# Patient Record
Sex: Female | Born: 1990 | Race: White | Hispanic: No | Marital: Single | Smoking: Never smoker
Health system: Southern US, Community
[De-identification: ages and names within clinical notes are randomized; demographics above are authoritative.]

---
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
## 2012-10-21 ENCOUNTER — Ambulatory Visit: Payer: Self-pay

## 2012-10-21 ENCOUNTER — Ambulatory Visit: Payer: Self-pay | Admitting: Emergency Medicine

## 2012-10-21 VITALS — BP 100/66 | HR 69 | Temp 97.9°F | Resp 18 | Ht 64.0 in | Wt 218.0 lb

## 2012-10-21 DIAGNOSIS — IMO0002 Reserved for concepts with insufficient information to code with codable children: Secondary | ICD-10-CM

## 2012-10-21 NOTE — Progress Notes (Signed)
  Subjective:    Patient ID: Jennifer Guzman, female    DOB: 1990-03-27, 22 y.o.   MRN: [id number]  HPI  22 YO female patient here today with an area on her thigh. She states "it is leaking water". It is not noticeable until it starts leaking. She first noticed the area last night. She denies pain unless pressure is applied.   She was 49-91 years old and presented with a bruise and unknown injury. Mom states this office did a biopsy. She was told it was just a birthmark. The bruise appears at random times. No pain is felt. The bruise goes away. The area never has leaked before.   Pt does not have insurance and cost is great concern at this time.  Review of Systems     Objective:   Physical Exam posterior right upper leg about 2 inches above the popliteal fossa is a small punctum when pressure is pushed around the punctum a clear urine color fluid is expressed.  [facility] reading (PRIMARY) by  Dr. Cleta Alberts normal x-rays of the femur .        Assessment & Plan:  Suspect Seroma We will culture serous fluid, Xray femur [facility] Dr Darrick Penna for Urgent consultation and [facility]. This could be simple lymphatic drainage. I am unsure why there is a punctum there where the fluid is draining. I just wanted to be sure there is not a fluid accumulation that would need to be addressed

## 2012-10-23 LAB — WOUND CULTURE
Gram Stain: NONE SEEN
Gram Stain: NONE SEEN
Gram Stain: NONE SEEN
Organism ID, Bacteria: NO GROWTH

## 2012-10-29 ENCOUNTER — Other Ambulatory Visit: Payer: Self-pay | Admitting: Sports Medicine

## 2016-01-04 ENCOUNTER — Emergency Department (HOSPITAL_COMMUNITY): Payer: BLUE CROSS/BLUE SHIELD

## 2016-01-04 ENCOUNTER — Encounter (HOSPITAL_COMMUNITY): Payer: Self-pay | Admitting: *Deleted

## 2016-01-04 ENCOUNTER — Emergency Department (HOSPITAL_COMMUNITY)
Admission: EM | Admit: 2016-01-04 | Discharge: 2016-01-05 | Disposition: A | Discharge status: Home / Self Care | Payer: BLUE CROSS/BLUE SHIELD | Attending: Emergency Medicine | Admitting: Emergency Medicine

## 2016-01-04 DIAGNOSIS — R079 Chest pain, unspecified: Secondary | ICD-10-CM

## 2016-01-04 DIAGNOSIS — R0789 Other chest pain: Secondary | ICD-10-CM | POA: Diagnosis not present

## 2016-01-04 DIAGNOSIS — R0602 Shortness of breath: Secondary | ICD-10-CM

## 2016-01-04 LAB — BASIC METABOLIC PANEL
Anion gap: 13 (ref 5–15)
BUN: 14 mg/dL (ref 6–20)
CO2: 21 mmol/L — ABNORMAL LOW (ref 22–32)
Calcium: 9.2 mg/dL (ref 8.9–10.3)
Chloride: 105 mmol/L (ref 101–111)
Creatinine, Ser: 0.84 mg/dL (ref 0.44–1.00)
GFR calc Af Amer: >60 mL/min (ref >60)
GFR calc non Af Amer: >60 mL/min (ref >60)
Glucose, Bld: 95 mg/dL (ref 65–99)
Potassium: 3 mmol/L — ABNORMAL LOW (ref 3.5–5.1)
Sodium: 139 mmol/L (ref 135–145)

## 2016-01-04 LAB — CBC
HCT: 39.3 % (ref 36.0–46.0)
Hemoglobin: 13.6 g/dL (ref 12.0–15.0)
MCH: 30.1 pg (ref 26.0–34.0)
MCHC: 34.6 g/dL (ref 30.0–36.0)
MCV: 86.9 fL (ref 78.0–100.0)
Platelets: 236 10*3/uL (ref 150–400)
RBC: 4.52 MIL/uL (ref 3.87–5.11)
RDW: 13.3 % (ref 11.5–15.5)
WBC: 13.7 10*3/uL — ABNORMAL HIGH (ref 4.0–10.5)

## 2016-01-04 LAB — D-DIMER, QUANTITATIVE: D-Dimer, Quant: 1.41 ug/mL-FEU — ABNORMAL HIGH (ref 0.00–0.50)

## 2016-01-04 LAB — I-STAT TROPONIN, ED: Troponin i, poc: 0.01 ng/mL (ref 0.00–0.08)

## 2016-01-04 MED ORDER — IOPAMIDOL (ISOVUE-370) INJECTION 76%
INTRAVENOUS | Status: AC
  Filled 2016-01-04: qty 100

## 2016-01-04 MED ORDER — IOPAMIDOL (ISOVUE-370) INJECTION 76%
INTRAVENOUS | Status: AC
  Administered 2016-01-04: 100 mL
  Filled 2016-01-04: qty 100

## 2016-01-04 NOTE — ED Provider Notes (Signed)
[facility]-EMERGENCY DEPT Provider Note   CSN: [id number] Arrival date & time: 01/04/16  1824     History   Chief Complaint Chief Complaint  Patient presents with  . Chest Pain  . Shortness of Breath  . Weakness    HPI Jennifer Guzman is a 25 y.o. female.  HPI   25 year old female who presents for evaluation of chest pain and shortness of breath. Patient report acute onset of sharp pain across the chest with associated shortness of breath, lightheadedness, feeling dizzy, pressure sensation in chest, and tingling sensation to her arms approximately 4 hours ago which lasted for about an hour and a half and has since resolved. Symptoms started when she was removing her bra. No specific treatment tried. Movement seems to make the pain worse. She has never had this symptom before. She denies any associated fever, chills, productive cough, hemoptysis, back pain, abdominal pain, nausea vomiting diarrhea, or rash. Denies any strenuous activity and denies any caffeine intake. No prior history of PE or DVT, no recent surgery, prolonged bed rest, unilateral leg swelling or calf pain, active cancer or taking oral hormone. No history of panic attack. No significant family history of premature cardiac death. Patient is a nonsmoker. She did report a month ago she was on a long trip traveling 1100 miles by car.   History reviewed. No pertinent past medical history.  There are no active problems to display for this patient.   History reviewed. No pertinent surgical history.  OB History    No data available       Home Medications    Prior to Admission medications   Medication Sig Start Date End Date Taking? Authorizing Provider  ibuprofen (ADVIL,MOTRIN) 200 MG tablet Take 400 mg by mouth every 6 (six) hours as needed (for pain).   Yes Historical Provider, MD    Family History History reviewed. No pertinent family history.  Social History Social History  Substance Use Topics  . Smoking  status: Never Smoker  . Smokeless tobacco: Not on file  . Alcohol use No     Allergies   Review of patient's allergies indicates no known allergies.   Review of Systems Review of Systems  All other systems reviewed and are negative.    Physical Exam Updated Vital Signs BP 119/67   Pulse 105   Temp 98.2 F (36.8 C) (Oral)   Resp 19   LMP 12/21/2015   SpO2 100%   Physical Exam  Constitutional: She appears well-developed and well-nourished. No distress.  Obese female resting comfortably appears to be in no acute distress.  HENT:  Head: Atraumatic.  Eyes: Conjunctivae are normal.  Neck: Normal range of motion. Neck supple. No JVD present.  Cardiovascular: Intact distal pulses.   Mild tachycardia without murmurs rubs or gallops  Pulmonary/Chest: Effort normal and breath sounds normal. No respiratory distress. She has no wheezes. She has no rales. She exhibits no tenderness.  Abdominal: Soft. She exhibits no distension. There is no tenderness.  Musculoskeletal:  Bilateral lower extremities without palpable cords, erythema, or edema.  Neurological: She is alert.  Skin: No rash noted.  Psychiatric: She has a normal mood and affect.  Nursing note and vitals reviewed.    ED Treatments / Results  Labs (all labs ordered are listed, but only abnormal results are displayed) Labs Reviewed  BASIC METABOLIC PANEL - Abnormal; Notable for the following:       Result Value   Potassium 3.0 (*)    CO2  21 (*)    All other components within normal limits  CBC - Abnormal; Notable for the following:    WBC 13.7 (*)    All other components within normal limits  D-DIMER, QUANTITATIVE (NOT AT [facility]) - Abnormal; Notable for the following:    D-Dimer, Quant 1.41 (*)    All other components within normal limits  I-STAT TROPOININ, ED    EKG  EKG Interpretation None      Date: 01/04/2016  Rate: 108  Rhythm: sinus tachycardia  QRS Axis: normal  Intervals: normal  ST/T Wave  abnormalities: normal  Conduction Disutrbances: none  Narrative Interpretation:   Old EKG Reviewed: No significant changes noted     Radiology Dg Chest 2 View  Result Date: 01/04/2016 CLINICAL DATA:  Chest pain EXAM: CHEST  2 VIEW COMPARISON:  None. FINDINGS: The heart size and mediastinal contours are within normal limits. Both lungs are clear. The visualized skeletal structures are unremarkable. IMPRESSION: No active cardiopulmonary disease. Electronically Signed   By: Marlan Palauharles  Clark M.D.   On: 01/04/2016 19:43   Ct Angio Chest Pe W Or Wo Contrast  Result Date: 01/05/2016 CLINICAL DATA:  Initial evaluation for acute chest pain, shortness of breath. 80 cc of Isovue EXAM: CT ANGIOGRAPHY CHEST WITH CONTRAST TECHNIQUE: Multidetector CT imaging of the chest was performed using the standard protocol during bolus administration of intravenous contrast. Multiplanar CT image reconstructions and MIPs were obtained to evaluate the vascular anatomy. CONTRAST:  Three hundred seventy. COMPARISON:  Prior radiograph from earlier the same day. FINDINGS: Cardiovascular: Intrathoracic aorta of normal caliber and appearance without acute abnormality. Great vessels within normal limits. Heart size normal.  No pericardial effusion. Pulmonary arteries adequately opacified for evaluation. Main pulmonary artery measures within normal limits for size at 2.6 cm. No filling defect to suggest acute pulmonary embolism identified. Re-formatted imaging confirms these findings. Mediastinum/Nodes: Visualized thyroid is normal. No mediastinal, hilar, or axillary adenopathy identified. Esophagus within normal limits. Lungs/Pleura: Mild hazy atelectatic he in the lower lobes bilaterally. Scattered air trapping present at the lung bases as well. No focal infiltrates. No pulmonary edema or pleural effusion. No pneumothorax. No worrisome pulmonary nodule or mass. Upper Abdomen: Visualized portions of the abdomen demonstrate no acute  abnormality. Layering stones present within the gallbladder lumen. No CT findings to suggest acute cholecystitis. No biliary dilatation. Musculoskeletal: No acute osseous abnormality. No worrisome lytic blastic osseous lesions. Review of the MIP images confirms the above findings. IMPRESSION: 1. No CT evidence for acute pulmonary embolism. 2. Mild dependent atelectasis within the lungs with mild scattered air trapping. No other acute cardiopulmonary abnormality identified. 3. Cholelithiasis. Electronically Signed   By: Rise MuBenjamin  McClintock M.D.   On: 01/05/2016 00:20    Procedures Procedures (including critical care time)  Medications Ordered in ED Medications  iopamidol (ISOVUE-370) 76 % injection (100 mLs  Contrast Given 01/04/16 2344)     Initial Impression / Assessment and Plan / ED Course  I have reviewed the triage vital signs and the nursing notes.  Pertinent labs & imaging results that were available during my care of the patient were reviewed by me and considered in my medical decision making (see chart for details).  Clinical Course    BP 114/79   Pulse 104   Temp 98.2 F (36.8 C) (Oral)   Resp 18   LMP 12/21/2015   SpO2 100%    Final Clinical Impressions(s) / ED Diagnoses   Final diagnoses:  Chest pain, unspecified type  Shortness of breath    New Prescriptions New Prescriptions   No medications on file   10:02 PM Patient presents with acute onset of chest pain or shortness of breath. The symptom has since resolved. She has a HEART score of 1, low risk for MACE.  She is mildly tachycardic, therefore unable to apply PERC criteria to r/o PE, therefore, d-dimer obtained.    10:46 PM Elevated d-dimer of 1.4.  Will obtain chest ct angio to r/o PE.    12:39 AM Chest CT angiogram shows no evidence of PE. Incidental finding of cholelithiasis. Patient at this time does not have any active pain. No tenderness to the right upper quadrant to suggest cholecystitis. At  this time patient is stable for discharge. Patient understands to return if her condition worsened. Encourage follow-up with primary care provider for further care.   Fayrene Helper, PA-C 01/05/16 0041    Raeford Razor, MD 01/09/16 1145

## 2016-01-04 NOTE — ED Triage Notes (Signed)
Pt states she took off her bra then had a sudden onset of chest pain, shortness of breath and weakness. Onset of pain about a hour ago. Pt denies cough, n/v/d, fevers.

## 2016-01-04 NOTE — ED Notes (Signed)
Patient back from CT.

## 2016-01-05 NOTE — Discharge Instructions (Signed)
You have been evaluated for your chest pain and shortness of breath.  No evidence of heart attack, blood clot in your lung, pneumonia or other concerning finding on today's exam.  You have evidence of gallstones in your gall bladder.  Please follow up with your doctor for further care.  Return if your condition worsen or if you have other concerns.

## 2016-01-05 NOTE — ED Notes (Signed)
Discharge instructions reviewed - voiced understanding 

## 2016-01-23 ENCOUNTER — Ambulatory Visit (INDEPENDENT_AMBULATORY_CARE_PROVIDER_SITE_OTHER): Payer: BLUE CROSS/BLUE SHIELD | Admitting: Physician Assistant

## 2016-01-23 VITALS — BP 118/84 | HR 81 | Temp 98.2°F | Resp 17 | Ht 64.0 in | Wt 280.0 lb

## 2016-01-23 DIAGNOSIS — R0789 Other chest pain: Secondary | ICD-10-CM | POA: Diagnosis not present

## 2016-01-23 DIAGNOSIS — K8021 Calculus of gallbladder without cholecystitis with obstruction: Secondary | ICD-10-CM

## 2016-01-23 DIAGNOSIS — R7989 Other specified abnormal findings of blood chemistry: Secondary | ICD-10-CM | POA: Diagnosis not present

## 2016-01-23 DIAGNOSIS — R945 Abnormal results of liver function studies: Secondary | ICD-10-CM

## 2016-01-23 LAB — POCT URINALYSIS DIP (MANUAL ENTRY)
BILIRUBIN UA: NEGATIVE
GLUCOSE UA: NEGATIVE
Ketones, POC UA: NEGATIVE
Leukocytes, UA: NEGATIVE
Nitrite, UA: NEGATIVE
Protein Ur, POC: NEGATIVE
RBC UA: NEGATIVE
SPEC GRAV UA: 1.02
Urobilinogen, UA: 1
pH, UA: 7

## 2016-01-23 LAB — POCT URINE PREGNANCY: Preg Test, Ur: NEGATIVE

## 2016-01-23 LAB — POCT CBC
Granulocyte percent: 85.4 %G — AB (ref 37–80)
HEMATOCRIT: 38.3 % (ref 37.7–47.9)
HEMOGLOBIN: 14 g/dL (ref 12.2–16.2)
LYMPH, POC: 1.1 (ref 0.6–3.4)
MCH: 31 pg (ref 27–31.2)
MCHC: 36.4 g/dL — AB (ref 31.8–35.4)
MCV: 85.2 fL (ref 80–97)
MID (cbc): 0.7 (ref 0–0.9)
MPV: 7.2 fL (ref 0–99.8)
POC GRANULOCYTE: 10.8 — AB (ref 2–6.9)
POC LYMPH PERCENT: 9 %L — AB (ref 10–50)
POC MID %: 5.6 % (ref 0–12)
Platelet Count, POC: 274 10*3/uL (ref 142–424)
RBC: 4.5 M/uL (ref 4.04–5.48)
RDW, POC: 12.8 %
WBC: 12.7 10*3/uL — AB (ref 4.6–10.2)

## 2016-01-23 LAB — POC MICROSCOPIC URINALYSIS (UMFC): MUCUS RE: ABSENT

## 2016-01-23 MED ORDER — CLONAZEPAM 0.5 MG PO TABS: 0.5000 mg | ORAL_TABLET | Freq: Two times a day (BID) | ORAL | 0 refills | Status: DC | PRN

## 2016-01-23 NOTE — Progress Notes (Signed)
01/25/2016 8:04 AM   DOB: 08/06/1990 / MRN: [id number]  SUBJECTIVE:  Jennifer Guzman is a 25 y.o. female presenting for cehst discomfort that started 3 weeks ago.  Describes this as a tightness and it will last anywhere from two to 5 hours.  Has been to the ED for evaluation which included negative trop, positive d-dimer with a negative CT angio.  She denies SOB, diaphoresis, radicular pattern or any exertional component.  Reports that when the pain leaves she is completely pain free.  She does not smoke or take birth control.  Pain is often brought about by eating.  Her mother and the whole family has a history of gall stones. She has not tried eating a fat free diet. She does have stress, and reports that she is in her junior year.   She has No Known Allergies.   She  has no past medical history on file.    She  reports that she has never smoked. She has never used smokeless tobacco. She reports that she does not drink alcohol or use drugs. She  has no sexual activity history on file. The patient  has no past surgical history on file.  Her family history includes Diabetes in her maternal grandmother; Heart disease in her mother.  Review of Systems  Constitutional: Negative for chills and fever.  Cardiovascular: Positive for chest pain. Negative for leg swelling and PND.  Gastrointestinal: Negative for nausea.  Musculoskeletal: Negative for myalgias.  Skin: Negative for itching and rash.  Neurological: Negative for dizziness and tingling.  Psychiatric/Behavioral: Negative for depression.    The problem list and medications were reviewed and updated by myself where necessary and exist elsewhere in the encounter.   OBJECTIVE:  BP 118/84 (BP Location: Right Arm, Patient Position: Sitting, Cuff Size: Large)   Pulse 81   Temp 98.2 F (36.8 C) (Oral)   Resp 17   Ht 5\' 4"  (1.626 m)   Wt 280 lb (127 kg)   LMP 01/14/2016   SpO2 99%   BMI 48.06 kg/m   Physical Exam  Constitutional: She  is oriented to person, place, and time.  Cardiovascular: Normal rate, regular rhythm and normal heart sounds.   Pulmonary/Chest: Effort normal and breath sounds normal.  Abdominal: Soft. Bowel sounds are normal. She exhibits no distension and no mass. There is no tenderness. There is no rebound and no guarding.  Musculoskeletal: Normal range of motion.  Neurological: She is alert and oriented to person, place, and time.  Skin: Skin is warm and dry.  Psychiatric: She has a normal mood and affect.    Pulse Readings from Last 3 Encounters:  01/23/16 81  01/05/16 87  10/21/12 69     Results for orders placed or performed in visit on 01/23/16 (from the past 72 hour(s))  Lipid panel     Status: Abnormal   Collection Time: 01/23/16  5:16 PM  Result Value Ref Range   Cholesterol 162 125 - 200 mg/dL   Triglycerides 63 <086<150 mg/dL   HDL 43 (L) >=57>=46 mg/dL   Total CHOL/HDL Ratio 3.8 <=5.0 Ratio   VLDL 13 <30 mg/dL   LDL Cholesterol 846106 <962<130 mg/dL    Comment:   Total Cholesterol/HDL Ratio:CHD Risk                        Coronary Heart Disease Risk Table  Men       Women          1/2 Average Risk              3.4        3.3              Average Risk              5.0        4.4           2X Average Risk              9.6        7.1           3X Average Risk             23.4       11.0 Use the calculated Patient Ratio above and the CHD Risk table  to determine the patient's CHD Risk.   TSH     Status: None   Collection Time: 01/23/16  5:16 PM  Result Value Ref Range   TSH 1.57 mIU/L    Comment:   Reference Range   > or = 20 Years  0.40-4.50   Pregnancy Range First trimester  0.26-2.66 Second trimester 0.55-2.73 Third trimester  0.43-2.91     COMPLETE METABOLIC PANEL WITH GFR     Status: Abnormal   Collection Time: 01/23/16  5:16 PM  Result Value Ref Range   Sodium 137 135 - 146 mmol/L   Potassium 3.9 3.5 - 5.3 mmol/L   Chloride 104 98 -  110 mmol/L   CO2 24 20 - 31 mmol/L   Glucose, Bld 103 (H) 65 - 99 mg/dL   BUN 13 7 - 25 mg/dL   Creat 1.61 0.96 - 0.45 mg/dL   Total Bilirubin 1.1 0.2 - 1.2 mg/dL   Alkaline Phosphatase 88 33 - 115 U/L   AST 168 (H) 10 - 30 U/L   ALT 124 (H) 6 - 29 U/L   Total Protein 7.1 6.1 - 8.1 g/dL   Albumin 4.0 3.6 - 5.1 g/dL   Calcium 9.4 8.6 - 40.9 mg/dL   GFR, Est African American >89 >=60 mL/min   GFR, Est Non African American >89 >=60 mL/min  Lipase     Status: None   Collection Time: 01/23/16  5:16 PM  Result Value Ref Range   Lipase 16 7 - 60 U/L  Hemoglobin A1c     Status: None   Collection Time: 01/23/16  5:16 PM  Result Value Ref Range   Hgb A1c MFr Bld 4.6 <5.7 %    Comment:   For the purpose of screening for the presence of diabetes:   <5.7%       Consistent with the absence of diabetes 5.7-6.4 %   Consistent with increased risk for diabetes (prediabetes) >=6.5 %     Consistent with diabetes   This assay result is consistent with a decreased risk of diabetes.   Currently, no consensus exists regarding use of hemoglobin A1c for diagnosis of diabetes in children.   According to American Diabetes Association (ADA) guidelines, hemoglobin A1c <7.0% represents optimal control in non-pregnant diabetic patients. Different metrics may apply to specific patient populations. Standards of Medical Care in Diabetes (ADA).      Mean Plasma Glucose 85 mg/dL  POCT CBC     Status: Abnormal   Collection Time: 01/23/16  5:28 PM  Result Value Ref Range  WBC 12.7 (A) 4.6 - 10.2 K/uL   Lymph, poc 1.1 0.6 - 3.4   POC LYMPH PERCENT 9.0 (A) 10 - 50 %L   MID (cbc) 0.7 0 - 0.9   POC MID % 5.6 0 - 12 %M   POC Granulocyte 10.8 (A) 2 - 6.9   Granulocyte percent 85.4 (A) 37 - 80 %G   RBC 4.50 4.04 - 5.48 M/uL   Hemoglobin 14.0 12.2 - 16.2 g/dL   HCT, POC 40.9 81.1 - 47.9 %   MCV 85.2 80 - 97 fL   MCH, POC 31.0 27 - 31.2 pg   MCHC 36.4 (A) 31.8 - 35.4 g/dL   RDW, POC 91.4 %   Platelet  Count, POC 274 142 - 424 K/uL   MPV 7.2 0 - 99.8 fL  POCT Microscopic Urinalysis ([facility])     Status: None   Collection Time: 01/23/16  5:55 PM  Result Value Ref Range   WBC,UR,HPF,POC None None WBC/hpf   RBC,UR,HPF,POC None None RBC/hpf   Bacteria None None, Too numerous to count   Mucus Absent Absent   Epithelial Cells, UR Per Microscopy None None, Too numerous to count cells/hpf  POCT urine pregnancy     Status: None   Collection Time: 01/23/16  5:55 PM  Result Value Ref Range   Preg Test, Ur Negative Negative  POCT urinalysis dipstick     Status: None   Collection Time: 01/23/16  5:55 PM  Result Value Ref Range   Color, UA yellow yellow   Clarity, UA clear clear   Glucose, UA negative negative   Bilirubin, UA negative negative   Ketones, POC UA negative negative   Spec Grav, UA 1.020    Blood, UA negative negative   pH, UA 7.0    Protein Ur, POC negative negative   Urobilinogen, UA 1.0    Nitrite, UA Negative Negative   Leukocytes, UA Negative Negative    No results found.  ASSESSMENT AND PLAN  Susane was seen today for chest pain.  Diagnoses and all orders for this visit:  Chest discomfort: EKG and previous labs reassuring.  This is likely anxiety or a gall bladder etiology.  I will screen for gall bladder disease in her labs, rule out pregnancy, and go ahead and try her on an anxiolytic.  If this fails and my labs are negative I will likely send her to GI given her most recent scan shows gall stones without evidence of end organ damage. She will try a fat free diet in the meantime.  -     POCT Microscopic Urinalysis ([facility]) -     POCT urine pregnancy -     POCT urinalysis dipstick -     POCT CBC -     Lipid panel -     TSH -     COMPLETE METABOLIC PANEL WITH GFR -     Lipase -     Hemoglobin A1c -     EKG 12-Lead    The patient is advised to call or return to clinic if she does not see an improvement in symptoms, or to seek the care of the closest emergency  department if she worsens with the above plan.   Deliah Boston, MHS, PA-C [facility] 01/25/2016 8:03 AM    Addendum 01/25/2016 8:05 AM: LFTs, recent scan, in conjunction with WBC pointing to a gall bladder etiology.  I spoke with patient last night who  has been avoiding fat in her diet and had no pain yesterday.  I am referring her to general surgery.  She will stay on low fat diet until that time.  Advised she avoid Klonopin. Deliah BostonMichael Norva Bowe, MS, PA-C 8:06 AM, 01/25/2016

## 2016-01-23 NOTE — Patient Instructions (Addendum)
Avoid any and all fats for now. Take Klonopin if this occurs again to see if it helps your symptoms.     IF you received an x-ray today, you will receive an invoice from [facility]. Please contact [facility] at [phone] with questions or concerns regarding your invoice.   IF you received labwork today, you will receive an invoice from [facility]. Please contact [facility] at [phone] with questions or concerns regarding your invoice.   Our billing staff will not be able to assist you with questions regarding bills from these companies.  You will be contacted with the lab results as soon as they are available. The fastest way to get your results is to activate your My Chart account. Instructions are located on the last page of this paperwork. If you have not heard from us regarding the results in 2 weeks, please contact this office.

## 2016-01-24 LAB — COMPLETE METABOLIC PANEL WITH GFR
ALBUMIN: 4 g/dL (ref 3.6–5.1)
ALK PHOS: 88 U/L (ref 33–115)
ALT: 124 U/L — AB (ref 6–29)
AST: 168 U/L — ABNORMAL HIGH (ref 10–30)
BILIRUBIN TOTAL: 1.1 mg/dL (ref 0.2–1.2)
BUN: 13 mg/dL (ref 7–25)
CALCIUM: 9.4 mg/dL (ref 8.6–10.2)
CHLORIDE: 104 mmol/L (ref 98–110)
CO2: 24 mmol/L (ref 20–31)
CREATININE: 0.78 mg/dL (ref 0.50–1.10)
GFR, Est Non African American: >89 mL/min (ref >=60)
Glucose, Bld: 103 mg/dL — ABNORMAL HIGH (ref 65–99)
Potassium: 3.9 mmol/L (ref 3.5–5.3)
Sodium: 137 mmol/L (ref 135–146)
Total Protein: 7.1 g/dL (ref 6.1–8.1)

## 2016-01-24 LAB — LIPID PANEL
Cholesterol: 162 mg/dL (ref 125–200)
HDL: 43 mg/dL — AB (ref >=46)
LDL CALC: 106 mg/dL (ref <130)
TRIGLYCERIDES: 63 mg/dL (ref <150)
Total CHOL/HDL Ratio: 3.8 Ratio (ref <=5.0)
VLDL: 13 mg/dL (ref <30)

## 2016-01-24 LAB — TSH: TSH: 1.57 mIU/L

## 2016-01-24 LAB — LIPASE: LIPASE: 16 U/L (ref 7–60)

## 2016-01-25 LAB — HEMOGLOBIN A1C
HEMOGLOBIN A1C: 4.6 % (ref <5.7)
Mean Plasma Glucose: 85 mg/dL

## 2016-01-25 NOTE — Progress Notes (Signed)
[facility] info to [facility] -- called to verify, states that the provider will review and schedule accordingly. Stressed urgency when speaking with their office.

## 2016-08-23 ENCOUNTER — Encounter (HOSPITAL_COMMUNITY): Payer: Self-pay

## 2016-08-23 ENCOUNTER — Emergency Department (HOSPITAL_COMMUNITY): Payer: Self-pay

## 2016-08-23 ENCOUNTER — Inpatient Hospital Stay (HOSPITAL_COMMUNITY)
Admission: EM | Admit: 2016-08-23 | Discharge: 2016-08-27 | DRG: 418 | Disposition: A | Discharge status: Home / Self Care | Payer: Self-pay | Attending: Family Medicine | Admitting: Family Medicine

## 2016-08-23 DIAGNOSIS — Z6841 Body Mass Index (BMI) 40.0 and over, adult: Secondary | ICD-10-CM

## 2016-08-23 DIAGNOSIS — E669 Obesity, unspecified: Secondary | ICD-10-CM | POA: Diagnosis present

## 2016-08-23 DIAGNOSIS — K8067 Calculus of gallbladder and bile duct with acute and chronic cholecystitis with obstruction: Principal | ICD-10-CM | POA: Diagnosis present

## 2016-08-23 DIAGNOSIS — E876 Hypokalemia: Secondary | ICD-10-CM | POA: Diagnosis present

## 2016-08-23 DIAGNOSIS — K8050 Calculus of bile duct without cholangitis or cholecystitis without obstruction: Secondary | ICD-10-CM

## 2016-08-23 DIAGNOSIS — K8001 Calculus of gallbladder with acute cholecystitis with obstruction: Secondary | ICD-10-CM

## 2016-08-23 LAB — URINALYSIS, ROUTINE W REFLEX MICROSCOPIC
GLUCOSE, UA: NEGATIVE mg/dL
Hgb urine dipstick: NEGATIVE
KETONES UR: 80 mg/dL — AB
Leukocytes, UA: NEGATIVE
NITRITE: NEGATIVE
PH: 5 (ref 5.0–8.0)
PROTEIN: 100 mg/dL — AB
Specific Gravity, Urine: 1.029 (ref 1.005–1.030)

## 2016-08-23 LAB — CBC
HCT: 40.3 % (ref 36.0–46.0)
HEMOGLOBIN: 13.9 g/dL (ref 12.0–15.0)
MCH: 29.5 pg (ref 26.0–34.0)
MCHC: 34.5 g/dL (ref 30.0–36.0)
MCV: 85.6 fL (ref 78.0–100.0)
PLATELETS: 270 10*3/uL (ref 150–400)
RBC: 4.71 MIL/uL (ref 3.87–5.11)
RDW: 12.9 % (ref 11.5–15.5)
WBC: 7.5 10*3/uL (ref 4.0–10.5)

## 2016-08-23 LAB — COMPREHENSIVE METABOLIC PANEL
ALK PHOS: 128 U/L — AB (ref 38–126)
ALT: 242 U/L — ABNORMAL HIGH (ref 14–54)
ANION GAP: 14 (ref 5–15)
AST: 86 U/L — ABNORMAL HIGH (ref 15–41)
Albumin: 3.9 g/dL (ref 3.5–5.0)
BUN: <5 mg/dL — ABNORMAL LOW (ref 6–20)
CALCIUM: 9.4 mg/dL (ref 8.9–10.3)
CO2: 23 mmol/L (ref 22–32)
Chloride: 99 mmol/L — ABNORMAL LOW (ref 101–111)
Creatinine, Ser: 0.8 mg/dL (ref 0.44–1.00)
Glucose, Bld: 80 mg/dL (ref 65–99)
Potassium: 3.5 mmol/L (ref 3.5–5.1)
SODIUM: 136 mmol/L (ref 135–145)
TOTAL PROTEIN: 8 g/dL (ref 6.5–8.1)
Total Bilirubin: 6.6 mg/dL — ABNORMAL HIGH (ref 0.3–1.2)

## 2016-08-23 LAB — I-STAT BETA HCG BLOOD, ED (MC, WL, AP ONLY): I-stat hCG, quantitative: <5 m[IU]/mL (ref <5)

## 2016-08-23 LAB — LIPASE, BLOOD: LIPASE: 27 U/L (ref 11–51)

## 2016-08-23 MED ORDER — ONDANSETRON HCL 4 MG/2ML IJ SOLN
4.0000 mg | Freq: Once | INTRAMUSCULAR | Status: AC
  Administered 2016-08-23: 4 mg via INTRAVENOUS
  Filled 2016-08-23: qty 2

## 2016-08-23 MED ORDER — MORPHINE SULFATE (PF) 4 MG/ML IV SOLN
4.0000 mg | Freq: Once | INTRAVENOUS | Status: AC
  Administered 2016-08-23: 4 mg via INTRAVENOUS
  Filled 2016-08-23: qty 1

## 2016-08-23 MED ORDER — SODIUM CHLORIDE 0.9 % IV BOLUS (SEPSIS)
1000.0000 mL | Freq: Once | INTRAVENOUS | Status: AC
  Administered 2016-08-23: 1000 mL via INTRAVENOUS

## 2016-08-23 NOTE — ED Provider Notes (Signed)
[facility]-EMERGENCY DEPT Provider Note   CSN: [id number] Arrival date & time: 08/23/16  1914     History   Chief Complaint Chief Complaint  Patient presents with  . Abdominal Pain    HPI Jennifer Guzman is a 26 y.o. female.  Patient presents to the ED with a chief complaint of RUQ pain.  She states that she has known gallstones.  She states that she has been having intermittent symptoms for several months, but her symptoms significantly worsened in the past week.  She reports that she has had persistent nausea and vomiting for the past week, and has been unable to keep anything down.  She denies any fevers, or chills.  Denies any other associated symptoms.  She has been trying to control her symptoms with diet with no relief.  Her boyfriend states that her eyes seem jaundice.   The history is provided by the patient. No language interpreter was used.    History reviewed. No pertinent past medical history.  There are no active problems to display for this patient.   History reviewed. No pertinent surgical history.  OB History    No data available       Home Medications    Prior to Admission medications   Medication Sig Start Date End Date Taking? Authorizing Provider  clonazePAM (KLONOPIN) 0.5 MG tablet Take 1 tablet (0.5 mg total) by mouth 2 (two) times daily as needed for anxiety. 01/23/16   Ofilia Neas, PA-C  ibuprofen (ADVIL,MOTRIN) 200 MG tablet Take 400 mg by mouth every 6 (six) hours as needed (for pain).    [provider]    Family History Family History  Problem Relation Age of Onset  . Heart disease Mother   . Diabetes Maternal Grandmother     Social History Social History  Substance Use Topics  . Smoking status: Never Smoker  . Smokeless tobacco: Never Used  . Alcohol use No     Allergies   Patient has no known allergies.   Review of Systems Review of Systems  All other systems reviewed and are negative.    Physical Exam Updated  Vital Signs BP 133/81   Pulse 86   Temp 98.3 F (36.8 C) (Oral)   Resp 18   LMP 08/10/2016   SpO2 100%   Physical Exam  Constitutional: She is oriented to person, place, and time. She appears well-developed and well-nourished.  HENT:  Head: Normocephalic and atraumatic.  Eyes: Conjunctivae and EOM are normal. Pupils are equal, round, and reactive to light. Scleral icterus (mild) is present.  Neck: Normal range of motion. Neck supple.  Cardiovascular: Normal rate and regular rhythm.  Exam reveals no gallop and no friction rub.   No murmur heard. Pulmonary/Chest: Effort normal and breath sounds normal. No respiratory distress. She has no wheezes. She has no rales. She exhibits no tenderness.  Abdominal: Soft. Bowel sounds are normal. She exhibits no distension and no mass. There is tenderness. There is no rebound and no guarding.  RUQ tender to palpation  Musculoskeletal: Normal range of motion. She exhibits no edema or tenderness.  Neurological: She is alert and oriented to person, place, and time.  Skin: Skin is warm and dry.  Psychiatric: She has a normal mood and affect. Her behavior is normal. Judgment and thought content normal.  Nursing note and vitals reviewed.    ED Treatments / Results  Labs (all labs ordered are listed, but only abnormal results are displayed) Labs Reviewed  COMPREHENSIVE METABOLIC PANEL - Abnormal; Notable for the following:       Result Value   Chloride 99 (*)    BUN <5 (*)    AST 86 (*)    ALT 242 (*)    Alkaline Phosphatase 128 (*)    Total Bilirubin 6.6 (*)    All other components within normal limits  URINALYSIS, ROUTINE W REFLEX MICROSCOPIC - Abnormal; Notable for the following:    Color, Urine AMBER (*)    APPearance HAZY (*)    Bilirubin Urine MODERATE (*)    Ketones, ur 80 (*)    Protein, ur 100 (*)    Bacteria, UA FEW (*)    Squamous Epithelial / LPF 6-30 (*)    All other components within normal limits  LIPASE, BLOOD  CBC    I-STAT BETA HCG BLOOD, ED ([facility], [facility], [facility] ONLY)    EKG  EKG Interpretation None       Radiology No results found.  Procedures Procedures (including critical care time)  Medications Ordered in ED Medications  morphine 4 MG/ML injection 4 mg (not administered)  ondansetron (ZOFRAN) injection 4 mg (not administered)  sodium chloride 0.9 % bolus 1,000 mL (not administered)     Initial Impression / Assessment and Plan / ED Course  I have reviewed the triage vital signs and the nursing notes.  Pertinent labs & imaging results that were available during my care of the patient were reviewed by me and considered in my medical decision making (see chart for details).     Patient with RUQ pain.  Worsened this week.  Persistent n/v x 1 week.  Appears dry.  Will give fluids, treat pain, and check US.  LFTs are up.   US shows CBD dilatation.  Concerning for choledocholithiasis.  Discussed the case with on-call GI from [facility], who recommends ERCP, but will likely happen Monday morning.  Appreciate Dr. Julian ReilGardner for admitting the patient. Final Clinical Impressions(s) / ED Diagnoses   Final diagnoses:  Choledocholithiasis    New Prescriptions New Prescriptions   No medications on file     Roxy HorsemanBrowning, Marae Cottrell, Cordelia Poche-C 08/24/16 [id number]    Derwood KaplanNanavati, Ankit, MD 08/24/16 1530

## 2016-08-23 NOTE — ED Triage Notes (Signed)
Pt dx with gallstones in 2017.  Pt has been monitoring food intake per surgeon.  Pain has been having pain that lasts 8-9 hours one a month since dx, then 1 week ago pain has been everyday.  Pt will vomit and pain will decrease,

## 2016-08-24 DIAGNOSIS — K8050 Calculus of bile duct without cholangitis or cholecystitis without obstruction: Secondary | ICD-10-CM

## 2016-08-24 DIAGNOSIS — R7989 Other specified abnormal findings of blood chemistry: Secondary | ICD-10-CM

## 2016-08-24 DIAGNOSIS — R1011 Right upper quadrant pain: Secondary | ICD-10-CM

## 2016-08-24 LAB — COMPREHENSIVE METABOLIC PANEL
ALT: 199 U/L — ABNORMAL HIGH (ref 14–54)
ANION GAP: 15 (ref 5–15)
AST: 71 U/L — ABNORMAL HIGH (ref 15–41)
Albumin: 3.2 g/dL — ABNORMAL LOW (ref 3.5–5.0)
Alkaline Phosphatase: 108 U/L (ref 38–126)
BUN: <5 mg/dL — ABNORMAL LOW (ref 6–20)
CHLORIDE: 101 mmol/L (ref 101–111)
CO2: 20 mmol/L — AB (ref 22–32)
Calcium: 8.9 mg/dL (ref 8.9–10.3)
Creatinine, Ser: 0.79 mg/dL (ref 0.44–1.00)
GFR calc non Af Amer: >60 mL/min (ref >60)
Glucose, Bld: 64 mg/dL — ABNORMAL LOW (ref 65–99)
POTASSIUM: 3.3 mmol/L — AB (ref 3.5–5.1)
SODIUM: 136 mmol/L (ref 135–145)
Total Bilirubin: 4.9 mg/dL — ABNORMAL HIGH (ref 0.3–1.2)
Total Protein: 6.6 g/dL (ref 6.5–8.1)

## 2016-08-24 LAB — CBC
HCT: 36.1 % (ref 36.0–46.0)
Hemoglobin: 12.3 g/dL (ref 12.0–15.0)
MCH: 29.5 pg (ref 26.0–34.0)
MCHC: 34.1 g/dL (ref 30.0–36.0)
MCV: 86.6 fL (ref 78.0–100.0)
PLATELETS: 232 10*3/uL (ref 150–400)
RBC: 4.17 MIL/uL (ref 3.87–5.11)
RDW: 13.2 % (ref 11.5–15.5)
WBC: 5.4 10*3/uL (ref 4.0–10.5)

## 2016-08-24 LAB — HIV ANTIBODY (ROUTINE TESTING W REFLEX): HIV Screen 4th Generation wRfx: NONREACTIVE

## 2016-08-24 MED ORDER — HEPARIN SODIUM (PORCINE) 5000 UNIT/ML IJ SOLN
5000.0000 [IU] | Freq: Three times a day (TID) | INTRAMUSCULAR | Status: DC
  Administered 2016-08-24: 5000 [IU] via SUBCUTANEOUS
  Filled 2016-08-24: qty 1

## 2016-08-24 MED ORDER — SODIUM CHLORIDE 0.9 % IV SOLN
INTRAVENOUS | Status: DC
  Administered 2016-08-24 – 2016-08-26 (×4): via INTRAVENOUS

## 2016-08-24 MED ORDER — ONDANSETRON HCL 4 MG/2ML IJ SOLN: 4.0000 mg | Freq: Once | INTRAMUSCULAR | Status: DC

## 2016-08-24 MED ORDER — SODIUM CHLORIDE 0.9 % IV SOLN: INTRAVENOUS | Status: DC

## 2016-08-24 NOTE — ED Notes (Signed)
Admitting Provider at bedside. 

## 2016-08-24 NOTE — Progress Notes (Signed)
[facility] PROGRESS NOTE  Brief Narrative: Jennifer Guzman is a 26 y.o. female with a history of symptomatic cholelithiasis who presented to the ED with recurrent RUQ pain. She describes "attacks" of severe pain in the right upper abdomen radiating to the back  intermittently reliably after food intake, worse with fatty foods, worsening over the past week, becoming associated with nausea and vomiting. She has been under the care of general surgery, monitoring her clinically. On arrival she was afebrile in no distress with RUQ tenderness. Abd U/S showed cholelithiasis without cholecystitis, and CBD dilation suggestive of choledocholithiasis. TBili 6.6, AST 86, ALT 242, Alk Phos 128. Normal CBC, negative UPT. GI was consulted and is planning ERCP 6/4.   Subjective: Abdominal pain controlled, no emesis since arrival to floor a few hours ago. No fevers.  Objective: VSS, afebrile, no distress. RRR, nonlabored breathing, CTA. Abd soft, obese, tender in RUQ without rebound.   Assessment & Plan: Choledocholithiasis:  - ERCP planned 6/4. Clears, antiemetics, analgesics, IVF, NPO p MN. - I am consulting general surgery, has seen Dr. Rosendo Gros.  Vance Gather, MD [facility] Pager [phone]

## 2016-08-24 NOTE — Consult Note (Addendum)
Consultation  Referring Provider: Dr. Jarvis Guzman     Primary Care Physician:  Patient, No Pcp Per Primary Gastroenterologist: Jennifer Guzman        Reason for Consultation: Choledocholithiasis             HPI:   Jennifer Guzman is a 26 y.o. female with no significant past medical history who presented to the ER on 08/23/16 with a complaint of RUQ abdominal pain.    Today, patient explains that she has been having RUQ abdominal pain, intermittent in nature which has been going on for the past 8 mos. She was initially seen in the ED for chest pain at that time, around November 2017, and was diagnosed with "stones in my gallbladder". She then followed with her PCP who sent her to the surgeon but because of the infrequency of her pain episodes at that time, a cholecystectomy was delayed. Since then the patient has continued with episodes of pain, worse when eating fatty food, which gave her symptoms of RUQ abdominal pain which radiated into her chest.  These would sometimes last a day, but typically were 1-2 hours in duration.   Over the past week patient had increase in frequency of pain and in fact has had constant RUQ pain over the past 5 days.  For the first time this has been associated with nausea and yesterday she was vomiting during a 9 hour intense pain period. She then proceeded to the ER.  Associated symptoms include chest pain and occasional SOB.   Patient's family history is positive for gallbladder disease in two of her grandmothers.   Patient denies fever, chills, change in appetite, use of drugs or etoh or pervious medical problems.  ED Course: US abdomen shows cholecystitis including stones in neck of gallbladder as well as bile duct dilation.   CMP shows elevated LFTs.  History reviewed. No pertinent past medical history.  History reviewed. No pertinent surgical history.  Family History  Problem Relation Age of Onset  . Heart disease Mother   . Diabetes Maternal Grandmother      Social History  Substance Use Topics  . Smoking status: Never Smoker  . Smokeless tobacco: Never Used  . Alcohol use No    Prior to Admission medications   Medication Sig Start Date End Date Taking? Authorizing Provider  ibuprofen (ADVIL,MOTRIN) 200 MG tablet Take 400 mg by mouth every 6 (six) hours as needed (for pain).   Yes [provider]  clonazePAM (KLONOPIN) 0.5 MG tablet Take 1 tablet (0.5 mg total) by mouth 2 (two) times daily as needed for anxiety. Patient not taking: Reported on 08/24/2016 01/23/16   Ofilia Neas, PA-C    Current Facility-Administered Medications  Medication Dose Route Frequency Provider Last Rate Last Dose  . 0.9 %  sodium chloride infusion   Intravenous Continuous Hillary Bow, DO 125 mL/hr at 08/24/16 0525    . heparin injection 5,000 Units  5,000 Units Subcutaneous Q8H Hillary Bow, DO   5,000 Units at 08/24/16 9528    Allergies as of 08/23/2016  . (No Known Allergies)     Review of Systems:    Constitutional: No weight loss, fever or chills Skin: No rash  Cardiovascular: No chest pain Respiratory: No SOB Gastrointestinal: See HPI and otherwise negative Genitourinary: No dysuria  Neurological: No headache Musculoskeletal: No new muscle or joint pain Hematologic: No bleeding  Psychiatric: No history of depression or anxiety Remainder of systems negative except as  above.  Physical Exam:  Vital signs in last 24 hours: Temp:  [98.3 F (36.8 C)-98.4 F (36.9 C)] 98.4 F (36.9 C) (06/03 0454) Pulse Rate:  [65-90] 65 (06/03 0454) Resp:  [17-18] 17 (06/03 0454) BP: (97-133)/(62-93) 115/75 (06/03 0454) SpO2:  [94 %-100 %] 99 % (06/03 0454) Weight:  [268 lb 9.6 oz (121.8 kg)] 268 lb 9.6 oz (121.8 kg) (06/03 0454) Last BM Date: 08/23/16 General:   Pleasant overweight Caucasian female appears to be in NAD, Well developed, Well nourished, alert and cooperative Head:  Normocephalic and atraumatic. Eyes:   PEERL, EOMI. Mild  scleral icterus Conjunctiva pink. Ears:  Normal auditory acuity. Neck:  Supple Throat: Oral cavity and pharynx without inflammation, swelling or lesion. Teeth in good condition. Lungs: Respirations even and unlabored. Lungs clear to auscultation bilaterally.   No wheezes, crackles, or rhonchi.  Heart: Normal S1, S2. No MRG. Regular rate and rhythm. No peripheral edema, cyanosis or pallor.  Abdomen:  Soft, nondistended, moderate RUQ tenderness. No rebound or guarding. Normal bowel sounds. No appreciable masses or hepatomegaly. Rectal:  Not performed.  Msk:  Symmetrical without gross deformities. Peripheral pulses intact.  Extremities:  Without edema, no deformity or joint abnormality. Normal ROM, normal sensation. Neurologic:  Alert and  oriented x4;  grossly normal neurologically. CN II-XII intact.  Skin:   Dry and intact without significant lesions or rashes.not jaundiced Psychiatric: Demonstrates good judgement and reason without abnormal affect or behaviors.   LAB RESULTS:  Recent Labs  08/23/16 1943 08/24/16 0408  WBC 7.5 5.4  HGB 13.9 12.3  HCT 40.3 36.1  PLT 270 232   BMET  Recent Labs  08/23/16 1943 08/24/16 0408  NA 136 136  K 3.5 3.3*  CL 99* 101  CO2 23 20*  GLUCOSE 80 64*  BUN <5* <5*  CREATININE 0.80 0.79  CALCIUM 9.4 8.9   LFT  Recent Labs  08/24/16 0408  PROT 6.6  ALBUMIN 3.2*  AST 71*  ALT 199*  ALKPHOS 108  BILITOT 4.9*  T bili 6 last evening  STUDIES: US Abdomen Limited  Result Date: 08/23/2016 CLINICAL DATA:  Right upper quadrant pain, chest pain, nausea, vomiting EXAM: US ABDOMEN LIMITED - RIGHT UPPER QUADRANT COMPARISON:  None. FINDINGS: Gallbladder: Multiple gallstones, the largest 9 mm. Summer noted in the region of the gallbladder neck. No wall thickening or sonographic Murphy's. Common bile duct: Diameter: Dilated common bile duct measuring up to 13-14 mm. The distal duct cannot be visualized due to overlying bowel gas. Liver: No focal  lesion identified. Within normal limits in parenchymal echogenicity. IMPRESSION: Cholelithiasis. Dilated common bile duct measuring up to 14 mm. The distal duct cannot be visualized, but the appearance is concerning for possible obstructing distal CBD stone. This could be further evaluated with MRCP or ERCP. Electronically Signed   By: Charlett Nose M.D.   On: 08/23/2016 23:32     PREVIOUS ENDOSCOPIES:            None   Impression / Plan:  Impression: 1. Choledocholithiasis: U/S as above with corresponding increase in LFT's, most suspicious for choledocholithiasis 2. Elevated LFT's: with above 3. RUQ abdominal pain: with above  Plan: 1. Plan for ERCP tomorrow. Did discuss risks, benefits, limitations and alternatives and the patient agrees to proceed. 2. Continue supportive measures 3. Will start SCD's for DVT prophylaxis and stop heparin 4. Patient to remain on clear liquids today and NPO after midnight 5. Please await any further recommendations from Dr. Myrtie Neither  Thank you for your kind consultation, we will continue to follow.  Jennifer Guzman  08/24/2016, 9:24 AM Pager #: [phone]   I have reviewed the entire case in detail with the above APP and discussed the plan in detail.  We saw the patient together, with the APP acting as my scribe.  My additional thoughts are as follows:  Significant CBD dilatation and total bilirubin > 4 indicates high likelihood of CBD stones, so will proceed directly to ERCP rather than MRCP. ERCP will be tomorrow because of anesthesia and endoscopy availability.  No urgent indication - no cholangitis. Patient also does not appear to have cholecystitis by imaging criteria.  The benefits and risks of the planned procedure were described in detail with the patient or (when appropriate) their health care proxy.  Risks were outlined as including, but not limited to, bleeding, infection, perforation, adverse medication reaction leading to cardiac or  pulmonary decompensation, or pancreatitis (if ERCP).  The limitation of incomplete mucosal visualization was also discussed.  No guarantees or warranties were given.  Diagram of anatomy shown to patient in discussion of procedure.  The patient's mother and a young female friend were present for entire encounter.  Surgical consult, please.  Hopefully schedule will allow lap chole the day after ERCP.   Charlie PitterHenry L Danis Guzman Pager [phone]  Mon-Fri 8a-5p [phone] after 5p, weekends, holidays

## 2016-08-24 NOTE — Progress Notes (Signed)
NURSING PROGRESS NOTE  Jennifer OchsJaclyn M Wyndham [id number] Admission Data: 08/24/2016 5:03 AM Attending Provider: Hillary BowGardner, Jared M, DO JYN:[id number]:Patient, No Pcp Per Code Status: Full  Jennifer Guzman is a 26 y.o. female patient admitted from ED:  -No acute distress noted.  -No complaints of shortness of breath.  -No complaints of chest pain.   Cardiac Monitoring: N/A  Blood pressure 115/75, pulse 65, temperature 98.4 F (36.9 C), temperature source Oral, resp. rate 17, height 5\' 4"  (1.626 m), weight 121.8 kg (268 lb 9.6 oz), last menstrual period 08/10/2016, SpO2 99 %.   IV Fluids:  IV in place, occlusive dsg intact without redness, IV cath antecubital left, condition patent and no redness normal saline.   Allergies:  Patient has no known allergies.  Past Medical History:   has no past medical history on file.  Past Surgical History:   has no past surgical history on file.  Social History:   reports that she has never smoked. She has never used smokeless tobacco. She reports that she does not drink alcohol or use drugs.  Skin: intact  Patient/Family orientated to room. Information packet given to patient/family. Admission inpatient armband information verified with patient/family to include name and date of birth and placed on patient arm. Side rails up x 2, fall assessment and education completed with patient/family. Patient/family able to verbalize understanding of risk associated with falls and verbalized understanding to call for assistance before getting out of bed. Call light within reach. Patient/family able to voice and demonstrate understanding of unit orientation instructions.

## 2016-08-24 NOTE — H&P (Signed)
History and Physical    Jennifer Guzman JYN:[id number] DOB: 10/23/1990 DOA: 08/23/2016  PCP: Patient, No Pcp Per  Patient coming from: Home  I have personally briefly reviewed patient's old medical records in [facility]  Chief Complaint: Abd pain  HPI: Jennifer Guzman is a 26 y.o. female with medical history significant of gallstones.  Patient presents to the ED with c/o RUQ abdominal pain.  She has been having intermittent symptoms of RUQ abd pain for the past couple of months since diagnosis some 7-8 months ago.  At that time her surgeon told her that they would just monitor the situation.  Her symptoms significantly worsened over this past week.  Has had persistent N/V over the past week and has been unable to keep anything down.  Symptoms are worse / occur with, eating.  ED Course: US abdomen shows cholecystitis including stones in neck of gallbladder as well as bile duct dilation.   CMP shows elevated LFTs.   Review of Systems: As per HPI otherwise 10 point review of systems negative.   History reviewed. No pertinent past medical history.  History reviewed. No pertinent surgical history.   reports that she has never smoked. She has never used smokeless tobacco. She reports that she does not drink alcohol or use drugs.  No Known Allergies  Family History  Problem Relation Age of Onset  . Heart disease Mother   . Diabetes Maternal Grandmother      Prior to Admission medications   Medication Sig Start Date End Date Taking? Authorizing Provider  ibuprofen (ADVIL,MOTRIN) 200 MG tablet Take 400 mg by mouth every 6 (six) hours as needed (for pain).   Yes [provider]  clonazePAM (KLONOPIN) 0.5 MG tablet Take 1 tablet (0.5 mg total) by mouth 2 (two) times daily as needed for anxiety. Patient not taking: Reported on 08/24/2016 01/23/16   Ofilia Neas, PA-C    Physical Exam: Vitals:   08/24/16 0200 08/24/16 0230 08/24/16 0300 08/24/16 0330  BP: 113/74 97/62  108/73 112/74  Pulse: 90 85 78 80  Resp:      Temp:      TempSrc:      SpO2: 96% 94% 96% 95%    Constitutional: NAD, calm, comfortable Eyes: PERRL, lids and conjunctivae normal ENMT: Mucous membranes are moist. Posterior pharynx clear of any exudate or lesions.Normal dentition.  Neck: normal, supple, no masses, no thyromegaly Respiratory: clear to auscultation bilaterally, no wheezing, no crackles. Normal respiratory effort. No accessory muscle use.  Cardiovascular: Regular rate and rhythm, no murmurs / rubs / gallops. No extremity edema. 2+ pedal pulses. No carotid bruits.  Abdomen: no tenderness, no masses palpated. No hepatosplenomegaly. Bowel sounds positive.  Musculoskeletal: no clubbing / cyanosis. No joint deformity upper and lower extremities. Good ROM, no contractures. Normal muscle tone.  Skin: no rashes, lesions, ulcers. No induration Neurologic: CN 2-12 grossly intact. Sensation intact, DTR normal. Strength 5/5 in all 4.  Psychiatric: Normal judgment and insight. Alert and oriented x 3. Normal mood.    Labs on Admission: I have personally reviewed following labs and imaging studies  CBC:  Recent Labs Lab 08/23/16 1943  WBC 7.5  HGB 13.9  HCT 40.3  MCV 85.6  PLT 270   Basic Metabolic Panel:  Recent Labs Lab 08/23/16 1943  NA 136  K 3.5  CL 99*  CO2 23  GLUCOSE 80  BUN <5*  CREATININE 0.80  CALCIUM 9.4   GFR: CrCl cannot be calculated (  Unknown ideal weight.). Liver Function Tests:  Recent Labs Lab 08/23/16 1943  AST 86*  ALT 242*  ALKPHOS 128*  BILITOT 6.6*  PROT 8.0  ALBUMIN 3.9    Recent Labs Lab 08/23/16 1943  LIPASE 27   No results for input(s): AMMONIA in the last 168 hours. Coagulation Profile: No results for input(s): INR, PROTIME in the last 168 hours. Cardiac Enzymes: No results for input(s): CKTOTAL, CKMB, CKMBINDEX, TROPONINI in the last 168 hours. BNP (last 3 results) No results for input(s): PROBNP in the last 8760  hours. HbA1C: No results for input(s): HGBA1C in the last 72 hours. CBG: No results for input(s): GLUCAP in the last 168 hours. Lipid Profile: No results for input(s): CHOL, HDL, LDLCALC, TRIG, CHOLHDL, LDLDIRECT in the last 72 hours. Thyroid Function Tests: No results for input(s): TSH, T4TOTAL, FREET4, T3FREE, THYROIDAB in the last 72 hours. Anemia Panel: No results for input(s): VITAMINB12, FOLATE, FERRITIN, TIBC, IRON, RETICCTPCT in the last 72 hours. Urine analysis:    Component Value Date/Time   COLORURINE AMBER (A) 08/23/2016 2042   APPEARANCEUR HAZY (A) 08/23/2016 2042   LABSPEC 1.029 08/23/2016 2042   PHURINE 5.0 08/23/2016 2042   GLUCOSEU NEGATIVE 08/23/2016 2042   HGBUR NEGATIVE 08/23/2016 2042   BILIRUBINUR MODERATE (A) 08/23/2016 2042   BILIRUBINUR negative 01/23/2016 1755   KETONESUR 80 (A) 08/23/2016 2042   PROTEINUR 100 (A) 08/23/2016 2042   UROBILINOGEN 1.0 01/23/2016 1755   NITRITE NEGATIVE 08/23/2016 2042   LEUKOCYTESUR NEGATIVE 08/23/2016 2042    Radiological Exams on Admission: [place] Abdomen Limited  Result Date: 08/23/2016 CLINICAL DATA:  Right upper quadrant pain, chest pain, nausea, vomiting EXAM: US ABDOMEN LIMITED - RIGHT UPPER QUADRANT COMPARISON:  None. FINDINGS: Gallbladder: Multiple gallstones, the largest 9 mm. Summer noted in the region of the gallbladder neck. No wall thickening or sonographic Murphy's. Common bile duct: Diameter: Dilated common bile duct measuring up to 13-14 mm. The distal duct cannot be visualized due to overlying bowel gas. Liver: No focal lesion identified. Within normal limits in parenchymal echogenicity. IMPRESSION: Cholelithiasis. Dilated common bile duct measuring up to 14 mm. The distal duct cannot be visualized, but the appearance is concerning for possible obstructing distal CBD stone. This could be further evaluated with MRCP or ERCP. Electronically Signed   By: Charlett NoseKevin  Dover M.D.   On: 08/23/2016 23:32    EKG: Independently  reviewed.  Assessment/Plan Active Problems:   Choledocholithiasis    1. Choledocholithiasis -  1. EDP spoke with GI, per them patient likely will need ERCP but this likely wont be done until Monday. 2. Therefore will put on heparin for DVT ppx and let patient have clear liquids 3. IVF  DVT prophylaxis: heparin [place] Code Status: Full Family Communication: Family at bedside Disposition Plan: Home after admit Consults called: EDP spoke with [facility] who will see in AM Admission status: Admit to inpatient   Hillary BowGARDNER, JARED M. DO [facility] Pager [phone]  If 7AM-7PM, please contact day team taking care of patient [email or web address] Password [email or web address]  08/24/2016, 4:08 AM

## 2016-08-25 ENCOUNTER — Encounter (HOSPITAL_COMMUNITY): Payer: Self-pay | Admitting: Internal Medicine

## 2016-08-25 ENCOUNTER — Encounter (HOSPITAL_COMMUNITY)
Admission: EM | Disposition: A | Discharge status: Home / Self Care | Payer: Self-pay | Source: Home / Self Care | Attending: Family Medicine

## 2016-08-25 ENCOUNTER — Inpatient Hospital Stay (HOSPITAL_COMMUNITY): Payer: Self-pay

## 2016-08-25 ENCOUNTER — Inpatient Hospital Stay (HOSPITAL_COMMUNITY): Payer: Self-pay | Admitting: Certified Registered"

## 2016-08-25 DIAGNOSIS — R109 Unspecified abdominal pain: Secondary | ICD-10-CM

## 2016-08-25 DIAGNOSIS — K838 Other specified diseases of biliary tract: Secondary | ICD-10-CM

## 2016-08-25 HISTORY — PX: ERCP: SHX5425

## 2016-08-25 LAB — CBC
HCT: 37.1 % (ref 36.0–46.0)
Hemoglobin: 12.5 g/dL (ref 12.0–15.0)
MCH: 29.4 pg (ref 26.0–34.0)
MCHC: 33.7 g/dL (ref 30.0–36.0)
MCV: 87.3 fL (ref 78.0–100.0)
Platelets: 188 10*3/uL (ref 150–400)
RBC: 4.25 MIL/uL (ref 3.87–5.11)
RDW: 13.3 % (ref 11.5–15.5)
WBC: 4.3 10*3/uL (ref 4.0–10.5)

## 2016-08-25 LAB — COMPREHENSIVE METABOLIC PANEL
ALT: 165 U/L — ABNORMAL HIGH (ref 14–54)
ANION GAP: 9 (ref 5–15)
AST: 64 U/L — ABNORMAL HIGH (ref 15–41)
Albumin: 3.3 g/dL — ABNORMAL LOW (ref 3.5–5.0)
Alkaline Phosphatase: 104 U/L (ref 38–126)
BILIRUBIN TOTAL: 2.9 mg/dL — AB (ref 0.3–1.2)
BUN: <5 mg/dL — ABNORMAL LOW (ref 6–20)
CALCIUM: 8.8 mg/dL — AB (ref 8.9–10.3)
CO2: 22 mmol/L (ref 22–32)
Chloride: 106 mmol/L (ref 101–111)
Creatinine, Ser: 0.7 mg/dL (ref 0.44–1.00)
GFR calc non Af Amer: >60 mL/min (ref >60)
Glucose, Bld: 80 mg/dL (ref 65–99)
POTASSIUM: 3.3 mmol/L — AB (ref 3.5–5.1)
SODIUM: 137 mmol/L (ref 135–145)
TOTAL PROTEIN: 6.1 g/dL — AB (ref 6.5–8.1)

## 2016-08-25 SURGERY — ERCP, WITH INTERVENTION IF INDICATED
Anesthesia: General

## 2016-08-25 MED ORDER — ONDANSETRON HCL 4 MG/2ML IJ SOLN
INTRAMUSCULAR | Status: DC | PRN
  Administered 2016-08-25: 4 mg via INTRAVENOUS

## 2016-08-25 MED ORDER — GLUCAGON HCL RDNA (DIAGNOSTIC) 1 MG IJ SOLR
INTRAMUSCULAR | Status: DC | PRN
  Administered 2016-08-25: .5 mg via INTRAVENOUS

## 2016-08-25 MED ORDER — LACTATED RINGERS IV SOLN
INTRAVENOUS | Status: DC
  Administered 2016-08-25: 14:00:00 via INTRAVENOUS
  Administered 2016-08-25: 1000 mL via INTRAVENOUS

## 2016-08-25 MED ORDER — PROPOFOL 10 MG/ML IV BOLUS
INTRAVENOUS | Status: DC | PRN
  Administered 2016-08-25: 180 mg via INTRAVENOUS

## 2016-08-25 MED ORDER — INDOMETHACIN 50 MG RE SUPP
100.0000 mg | Freq: Once | RECTAL | Status: AC
  Administered 2016-08-25: 100 mg via RECTAL
  Filled 2016-08-25: qty 2

## 2016-08-25 MED ORDER — INDOMETHACIN 50 MG RE SUPP
RECTAL | Status: AC
Start: 2016-08-25 — End: 2016-08-25
  Filled 2016-08-25: qty 2

## 2016-08-25 MED ORDER — MIDAZOLAM HCL 2 MG/2ML IJ SOLN
INTRAMUSCULAR | Status: DC | PRN
  Administered 2016-08-25: 2 mg via INTRAVENOUS

## 2016-08-25 MED ORDER — LIDOCAINE 2% (20 MG/ML) 5 ML SYRINGE
INTRAMUSCULAR | Status: DC | PRN
  Administered 2016-08-25: 100 mg via INTRAVENOUS

## 2016-08-25 MED ORDER — SODIUM CHLORIDE 0.9 % IV SOLN
3.0000 g | INTRAVENOUS | Status: AC
  Administered 2016-08-25: 3 g via INTRAVENOUS
  Filled 2016-08-25 (×2): qty 3

## 2016-08-25 MED ORDER — GLUCAGON HCL RDNA (DIAGNOSTIC) 1 MG IJ SOLR
INTRAMUSCULAR | Status: AC
  Filled 2016-08-25: qty 1

## 2016-08-25 MED ORDER — IOPAMIDOL (ISOVUE-300) INJECTION 61%
INTRAVENOUS | Status: AC
  Filled 2016-08-25: qty 50

## 2016-08-25 MED ORDER — FENTANYL CITRATE (PF) 100 MCG/2ML IJ SOLN
INTRAMUSCULAR | Status: DC | PRN
  Administered 2016-08-25 (×2): 50 ug via INTRAVENOUS

## 2016-08-25 MED ORDER — ROCURONIUM BROMIDE 10 MG/ML (PF) SYRINGE
PREFILLED_SYRINGE | INTRAVENOUS | Status: DC | PRN
  Administered 2016-08-25: 20 mg via INTRAVENOUS
  Administered 2016-08-25: 50 mg via INTRAVENOUS

## 2016-08-25 MED ORDER — POTASSIUM PHOSPHATES 15 MMOLE/5ML IV SOLN
40.0000 meq | Freq: Once | INTRAVENOUS | Status: AC
  Administered 2016-08-25: 40 meq via INTRAVENOUS
  Filled 2016-08-25: qty 9.09

## 2016-08-25 MED ORDER — SUGAMMADEX SODIUM 200 MG/2ML IV SOLN
INTRAVENOUS | Status: DC | PRN
  Administered 2016-08-25: 500 mg via INTRAVENOUS

## 2016-08-25 MED ORDER — SODIUM CHLORIDE 0.9 % IV SOLN
INTRAVENOUS | Status: DC | PRN
  Administered 2016-08-25: 40 mL

## 2016-08-25 MED ORDER — INDOMETHACIN 50 MG RE SUPP
RECTAL | Status: DC | PRN
  Administered 2016-08-25: 100 mg via RECTAL

## 2016-08-25 NOTE — Progress Notes (Addendum)
[facility] PROGRESS NOTE  Brief Narrative: Jennifer AMBROCIO is a 26 y.o. female with a history of symptomatic cholelithiasis who presented to the ED with recurrent RUQ pain. She describes "attacks" of severe pain in the right upper abdomen radiating to the back  intermittently reliably after food intake, worse with fatty foods, worsening over the past week, becoming associated with nausea and vomiting. She has been under the care of general surgery, monitoring her clinically. On arrival she was afebrile in no distress with RUQ tenderness. Abd U/S showed cholelithiasis without cholecystitis, and CBD dilation suggestive of choledocholithiasis. TBili 6.6, AST 86, ALT 242, Alk Phos 128. Normal CBC, negative UPT. GI was consulted and is planning ERCP 6/4. General surgery planning laparoscopic cholecystectomy 6/5.   Subjective: Abdominal pain controlled, improving. Some nausea with light diet yesterday, no emesis. No fevers.    Objective: BP (!) 98/59 (BP Location: Right Arm)   Pulse 72   Temp 97.9 F (36.6 C) (Oral)   Resp 18   Ht '5\' 4"'  (1.626 m)   Wt 121.8 kg (268 lb 9.6 oz)   LMP 08/10/2016   SpO2 99%   BMI 46.11 kg/m  Gen: well-appearing 26 y.o.female in NAD HEENT: PERRL, MMM, posterior oropharynx clear, normal dentition Pulm: Non-labored; CTAB, no wheezes  CV: Regular rate, no murmur; distal pulses intact/symmetric; no LE edema GI: + BS; soft, obese, RUQ tenderness, non-distended, no HSM, no hernia Skin: No rashes, wounds, ulcers MSK: Normal gait and station; no digital clubbing/cyanosis Neuro: CN II-XII without deficits, sensation intact to light touch Psych: A&Ox3, mood eruthymic with congruent affect, speech clear and coherent  Assessment & Plan: Choledocholithiasis: LFTs trending downward.  - ERCP planned 6/4. NPO, antiemetics, analgesics, IVF  - Lap chole 6/5 per general surgery.  Hypokalemia: Acute, mild. Suspected due to GI losses.  - Replete and recheck with Mg in  AM  Vance Gather, MD [facility] Pager [phone]

## 2016-08-25 NOTE — Op Note (Signed)
[facility] Patient Name: Jennifer Guzman Procedure Date : 08/25/2016 MRN: [id number] Attending MD: Wilhemina Bonito. Marina Goodell , MD Date of Birth: January 14, 1991 CSN: [id number] Age: 26 Admit Type: Inpatient Procedure:                ERCP, with biliary sphincterotomy and common duct                            stone extraction Indications:              Abdominal pain of suspected biliary origin, Biliary                            dilation on Ultrasound, Elevated liver enzymes Providers:                Wilhemina Bonito. Marina Goodell, MD, Dwain Sarna, RN, Margo Aye, Technician, Arlee Muslim Tech., Technician Referring MD:             [facility] Medicines:                General Anesthesia Complications:            No immediate complications. Estimated Blood Loss:     Estimated blood loss: none. Procedure:                Pre-Anesthesia Assessment:                           - Prior to the procedure, a History and Physical                            was performed, and patient medications and                            allergies were reviewed. The patient is competent.                            The risks and benefits of the procedure and the                            sedation options and risks were discussed with the                            patient. All questions were answered and informed                            consent was obtained. Patient identification and                            proposed procedure were verified by the physician.                            Mental Status Examination: alert and oriented.  Airway Examination: normal oropharyngeal airway and                            neck mobility. Respiratory Examination: clear to                            auscultation. CV Examination: normal. Prophylactic                            Antibiotics: The patient does not require                            prophylactic antibiotics. Prior  Anticoagulants: The                            patient has taken no previous anticoagulant or                            antiplatelet agents. ASA Grade Assessment: II - A                            patient with mild systemic disease. After reviewing                            the risks and benefits, the patient was deemed in                            satisfactory condition to undergo the procedure.                            The anesthesia plan was to use moderate sedation /                            analgesia (conscious sedation). Immediately prior                            to administration of medications, the patient was                            re-assessed for adequacy to receive sedatives. The                            heart rate, respiratory rate, oxygen saturations,                            blood pressure, adequacy of pulmonary ventilation,                            and response to care were monitored throughout the                            procedure. The physical status of the patient was  re-assessed after the procedure.                           After obtaining informed consent, the scope was                            passed under direct vision. Throughout the                            procedure, the patient's blood pressure, pulse, and                            oxygen saturations were monitored continuously. The                            [id number]-[id number] ([id number]) scope was introduced through                            the mouth, and used to inject contrast into and                            used to inject contrast into the bile duct. The                            ERCP was accomplished without difficulty. The                            patient tolerated the procedure well. Scope In: Scope Out: Findings:      The esophagus was successfully intubated under direct vision. The scope       was advanced to a normal major papilla in the descending  duodenum       without detailed examination of the pharynx, larynx and associated       structures, and upper GI tract. The upper GI tract was grossly normal.       The ampulla was normal.The bile duct was deeply cannulated with the       traction (standard) sphincterotome on first pass. Contrast was injected.       I personally interpreted the bile duct images. Contrast extended to the       entire biliary tree. Opacification of the entire biliary tree except for       the cystic duct and gallbladder was successful. The entire biliary tree       was moderately dilated. 2 filling defects in the distal duct consistent       with stones were identified. A 0.035 inch straight standard wire was       passed into the biliary tree. Biliary sphincterotomy was made with a       traction (standard) sphincterotome using ERBE electrocautery. The size       was deemed moderate to large. There was no post-sphincterotomy bleeding.       The biliary tree was swept with a 15 mm balloon starting at the       bifurcation. All stones were removed. No stones remained. Post       extraction occlusion cholangiogram demonstrated no residual filling       defects. Excellent drainage There was  no injection or manipulation of       the pancreatic duct. Impression:               1. Choledocholithiasis status post ERC with biliary                            sphincterotomy and common bile duct stone extraction                           2. Cholelithiasis. Moderate Sedation:      none Recommendation:           1. Standard post ERCP observation including                            vigorous hydration with lactated Ringer solution                            and placement of indomethacin suppository                           2. Plans for laparoscopic cholecystectomy with                            general surgery tomorrow                           3. Inpatient GI hospital team available as needed. Procedure Code(s):         --- Professional ---                           [phone], Endoscopic retrograde                            cholangiopancreatography (ERCP); with removal of                            calculi/debris from biliary/pancreatic duct(s)                           43262, Endoscopic retrograde                            cholangiopancreatography (ERCP); with                            sphincterotomy/papillotomy Diagnosis Code(s):        --- Professional ---                           K83.8, Other specified diseases of biliary tract                           K80.50, Calculus of bile duct without cholangitis                            or cholecystitis without obstruction  R10.9, Unspecified abdominal pain                           R74.8, Abnormal levels of other serum enzymes CPT copyright 2016 American Medical Association. All rights reserved. The codes documented in this report are preliminary and upon coder review may  be revised to meet current compliance requirements. Wilhemina BonitoJohn N. Marina GoodellPerry, MD 08/25/2016 2:28:02 PM This report has been signed electronically. Number of Addenda: 0

## 2016-08-25 NOTE — Care Management Note (Signed)
Case Management Note  Patient Details  Name: Jennifer Guzman MRN: [id number] Date of Birth: 09/28/1990  Subjective/Objective:        Admitted with Choledocholithiasis, hx  Of gallstones. From home with family. Independent with  ADL's, no DME usage PTA.  Dian SituLorraine C Prospero (Mother) Delight OvensRichard Fister (Father)    [phone]         6/4 s/p ERCP, with biliary sphincterotomy and common duct stone extraction    PCP:  Pt without PCP, CM [facility]                          Action/Plan: Discharge planning in process...Marland Kitchen.Marland Kitchen.Marland Kitchen. CM to follow for disposition  needs.  Expected Discharge Date:                  Expected Discharge Plan:  Home/Self Care  In-House Referral:     Discharge planning Services  CM Consult  Post Acute Care Choice:    Choice offered to:     DME Arranged:    DME Agency:     HH Arranged:    HH Agency:     Status of Service:  In process, will continue to follow  If discussed at Long Length of Stay Meetings, dates discussed:    Additional Comments:  Epifanio LeschesCole, Ellice Boultinghouse Hudson, RN 08/25/2016, 8:14 PM

## 2016-08-25 NOTE — Anesthesia Preprocedure Evaluation (Addendum)
Anesthesia Evaluation  Patient identified by MRN, date of birth, ID band Patient awake    Reviewed: Allergy & Precautions, NPO status , Patient's Chart, lab work & pertinent test results  Airway Mallampati: I       Dental  (+) Teeth Intact, Dental Advisory Given   Pulmonary neg pulmonary ROS,    breath sounds clear to auscultation       Cardiovascular negative cardio ROS   Rhythm:Regular Rate:Normal     Neuro/Psych negative neurological ROS  negative psych ROS   GI/Hepatic Neg liver ROS, gallstones   Endo/Other  negative endocrine ROSMorbid obesity  Renal/GU negative Renal ROS  negative genitourinary   Musculoskeletal negative musculoskeletal ROS (+)   Abdominal   Peds negative pediatric ROS (+)  Hematology negative hematology ROS (+)   Anesthesia Other Findings   Reproductive/Obstetrics negative OB ROS                            Anesthesia Physical Anesthesia Plan  ASA: II  Anesthesia Plan: General   Post-op Pain Management:    Induction: Intravenous  Airway Management Planned: Oral ETT  Additional Equipment:   Intra-op Plan:   Post-operative Plan: Extubation in OR  Informed Consent: I have reviewed the patients History and Physical, chart, labs and discussed the procedure including the risks, benefits and alternatives for the proposed anesthesia with the patient or authorized representative who has indicated his/her understanding and acceptance.   Dental advisory given  Plan Discussed with: CRNA  Anesthesia Plan Comments:         Anesthesia Quick Evaluation

## 2016-08-25 NOTE — Anesthesia Postprocedure Evaluation (Signed)
Anesthesia Post Note  Patient: Jennifer Guzman  Procedure(s) Performed: Procedure(s) (LRB): ENDOSCOPIC RETROGRADE CHOLANGIOPANCREATOGRAPHY (ERCP) (N/A)     Patient location during evaluation: Endoscopy Anesthesia Type: General Level of consciousness: awake and alert Pain management: pain level controlled Vital Signs Assessment: post-procedure vital signs reviewed and stable Respiratory status: spontaneous breathing, nonlabored ventilation, respiratory function stable and patient connected to nasal cannula oxygen Cardiovascular status: blood pressure returned to baseline and stable Postop Assessment: no signs of nausea or vomiting Anesthetic complications: no    Last Vitals:  Vitals:   08/25/16 1456 08/25/16 1516  BP: 126/83 112/72  Pulse: 85 70  Resp: 20 18  Temp:  37 C    Last Pain:  Vitals:   08/25/16 1516  TempSrc: Oral  PainSc:                  Tywanda Rice,JAMES TERRILL

## 2016-08-25 NOTE — H&P (View-Only) (Signed)
Consultation  Referring Provider: Dr. Jarvis Newcomer     Primary Care Physician:  Patient, [name]        Reason for Consultation: Choledocholithiasis             HPI:   Jennifer Guzman is a 26 y.o. female with no significant past medical history who presented to the ER on 08/23/16 with a complaint of RUQ abdominal pain.    Today, patient explains that she has been having RUQ abdominal pain, intermittent in nature which has been going on for the past 8 mos. She was initially seen in the ED for chest pain at that time, around November 2017, and was diagnosed with "stones in my gallbladder". She then followed with her PCP who sent her to the surgeon but because of the infrequency of her pain episodes at that time, a cholecystectomy was delayed. Since then the patient has continued with episodes of pain, worse when eating fatty food, which gave her symptoms of RUQ abdominal pain which radiated into her chest.  These would sometimes last a day, but typically were 1-2 hours in duration.   Over the past week patient had increase in frequency of pain and in fact has had constant RUQ pain over the past 5 days.  For the first time this has been associated with nausea and yesterday she was vomiting during a 9 hour intense pain period. She then proceeded to the ER.  Associated symptoms include chest pain and occasional SOB.   Patient's family history is positive for gallbladder disease in two of her grandmothers.   Patient denies fever, chills, change in appetite, use of drugs or etoh or pervious medical problems.  ED Course: US abdomen shows cholecystitis including stones in neck of gallbladder as well as bile duct dilation.   CMP shows elevated LFTs.  History reviewed. No pertinent past medical history.  History reviewed. No pertinent surgical history.  Family History  Problem Relation Age of Onset  . Heart disease Mother   . Diabetes Maternal Grandmother      Social History  Substance Use Topics  . Smoking status: Never Smoker  . Smokeless tobacco: Never Used  . Alcohol use No    Prior to Admission medications   Medication Sig Start Date End Date Taking? Authorizing Provider  ibuprofen (ADVIL,MOTRIN) 200 MG tablet Take 400 mg by mouth every 6 (six) hours as needed (for pain).   Yes [provider]  clonazePAM (KLONOPIN) 0.5 MG tablet Take 1 tablet (0.5 mg total) by mouth 2 (two) times daily as needed for anxiety. Patient not taking: Reported on 08/24/2016 01/23/16   Ofilia Neas, PA-C    Current Facility-Administered Medications  Medication Dose Route Frequency Provider Last Rate Last Dose  . 0.9 %  sodium chloride infusion   Intravenous Continuous Hillary Bow, DO 125 mL/hr at 08/24/16 0525    . heparin injection 5,000 Units  5,000 Units Subcutaneous Q8H Hillary Bow, DO   5,000 Units at 08/24/16 9528    Allergies as of 08/23/2016  . (No Known Allergies)     Review of Systems:    Constitutional: No weight loss, fever or chills Skin: No rash  Cardiovascular: No chest pain Respiratory: No SOB Gastrointestinal: See HPI and otherwise negative Genitourinary: No dysuria  Neurological: No headache Musculoskeletal: No new muscle or joint pain Hematologic: No bleeding  Psychiatric: No history of depression or anxiety Remainder of systems negative except as  above.  Physical Exam:  Vital signs in last 24 hours: Temp:  [98.3 F (36.8 C)-98.4 F (36.9 C)] 98.4 F (36.9 C) (06/03 0454) Pulse Rate:  [65-90] 65 (06/03 0454) Resp:  [17-18] 17 (06/03 0454) BP: (97-133)/(62-93) 115/75 (06/03 0454) SpO2:  [94 %-100 %] 99 % (06/03 0454) Weight:  [268 lb 9.6 oz (121.8 kg)] 268 lb 9.6 oz (121.8 kg) (06/03 0454) Last BM Date: 08/23/16 General:   Pleasant overweight Caucasian female appears to be in NAD, Well developed, Well nourished, alert and cooperative Head:  Normocephalic and atraumatic. Eyes:   PEERL, EOMI. Mild  scleral icterus Conjunctiva pink. Ears:  Normal auditory acuity. Neck:  Supple Throat: Oral cavity and pharynx without inflammation, swelling or lesion. Teeth in good condition. Lungs: Respirations even and unlabored. Lungs clear to auscultation bilaterally.   No wheezes, crackles, or rhonchi.  Heart: Normal S1, S2. No MRG. Regular rate and rhythm. No peripheral edema, cyanosis or pallor.  Abdomen:  Soft, nondistended, moderate RUQ tenderness. No rebound or guarding. Normal bowel sounds. No appreciable masses or hepatomegaly. Rectal:  Not performed.  Msk:  Symmetrical without gross deformities. Peripheral pulses intact.  Extremities:  Without edema, no deformity or joint abnormality. Normal ROM, normal sensation. Neurologic:  Alert and  oriented x4;  grossly normal neurologically. CN II-XII intact.  Skin:   Dry and intact without significant lesions or rashes.not jaundiced Psychiatric: Demonstrates good judgement and reason without abnormal affect or behaviors.   LAB RESULTS:  Recent Labs  08/23/16 1943 08/24/16 0408  WBC 7.5 5.4  HGB 13.9 12.3  HCT 40.3 36.1  PLT 270 232   BMET  Recent Labs  08/23/16 1943 08/24/16 0408  NA 136 136  K 3.5 3.3*  CL 99* 101  CO2 23 20*  GLUCOSE 80 64*  BUN <5* <5*  CREATININE 0.80 0.79  CALCIUM 9.4 8.9   LFT  Recent Labs  08/24/16 0408  PROT 6.6  ALBUMIN 3.2*  AST 71*  ALT 199*  ALKPHOS 108  BILITOT 4.9*  T bili 6 last evening  STUDIES: US Abdomen Limited  Result Date: 08/23/2016 CLINICAL DATA:  Right upper quadrant pain, chest pain, nausea, vomiting EXAM: US ABDOMEN LIMITED - RIGHT UPPER QUADRANT COMPARISON:  None. FINDINGS: Gallbladder: Multiple gallstones, the largest 9 mm. Summer noted in the region of the gallbladder neck. No wall thickening or sonographic Murphy's. Common bile duct: Diameter: Dilated common bile duct measuring up to 13-14 mm. The distal duct cannot be visualized due to overlying bowel gas. Liver: No focal  lesion identified. Within normal limits in parenchymal echogenicity. IMPRESSION: Cholelithiasis. Dilated common bile duct measuring up to 14 mm. The distal duct cannot be visualized, but the appearance is concerning for possible obstructing distal CBD stone. This could be further evaluated with MRCP or ERCP. Electronically Signed   By: Charlett Nose M.D.   On: 08/23/2016 23:32     PREVIOUS ENDOSCOPIES:            None   Impression / Plan:  Impression: 1. Choledocholithiasis: U/S as above with corresponding increase in LFT's, most suspicious for choledocholithiasis 2. Elevated LFT's: with above 3. RUQ abdominal pain: with above  Plan: 1. Plan for ERCP tomorrow. Did discuss risks, benefits, limitations and alternatives and the patient agrees to proceed. 2. Continue supportive measures 3. Will start SCD's for DVT prophylaxis and stop heparin 4. Patient to remain on clear liquids today and NPO after midnight 5. Please await any further recommendations from Dr. Myrtie Neither  Thank you for your kind consultation, we will continue to follow.  Violet BaldyJennifer Lynne Lemmon  08/24/2016, 9:24 AM Pager #: [phone]   I have reviewed the entire case in detail with the above APP and discussed the plan in detail.  We saw the patient together, with the APP acting as my scribe.  My additional thoughts are as follows:  Significant CBD dilatation and total bilirubin > 4 indicates high likelihood of CBD stones, so will proceed directly to ERCP rather than MRCP. ERCP will be tomorrow because of anesthesia and endoscopy availability.  No urgent indication - no cholangitis. Patient also does not appear to have cholecystitis by imaging criteria.  The benefits and risks of the planned procedure were described in detail with the patient or (when appropriate) their health care proxy.  Risks were outlined as including, but not limited to, bleeding, infection, perforation, adverse medication reaction leading to cardiac or  pulmonary decompensation, or pancreatitis (if ERCP).  The limitation of incomplete mucosal visualization was also discussed.  No guarantees or warranties were given.  Diagram of anatomy shown to patient in discussion of procedure.  The patient's mother and a young female friend were present for entire encounter.  Surgical consult, please.  Hopefully schedule will allow lap chole the day after ERCP.   Charlie PitterHenry L Danis III Pager [phone]  Mon-Fri 8a-5p [phone] after 5p, weekends, holidays

## 2016-08-25 NOTE — Interval H&P Note (Signed)
History and Physical Interval Note:  08/25/2016 1:32 PM  Geralynn OchsJaclyn M Roderick  has presented today for surgery, with the diagnosis of Cholecystitis, gallstones, CBD stones  The various methods of treatment have been discussed with the patient and family. After consideration of risks, benefits and other options for treatment, the patient has consented to  Procedure(s): ENDOSCOPIC RETROGRADE CHOLANGIOPANCREATOGRAPHY (ERCP) (N/A) as a surgical intervention .  The patient's history has been reviewed, patient examined, no change in status, stable for surgery.  I have reviewed the patient's chart and labs.  Questions were answered to the patient's satisfaction.    History, laboratories, x-rays reviewed personally. Patient personally seen and examined at bedside prior to ERCP. No pain presently. Benign abdomen. Has been seen by surgery. I reviewed with her the nature of the procedure in detail as well as potential complications and their management should they occur. She understood. All questions answered to her satisfaction.   Yancey FlemingsJohn Londynn Sonoda

## 2016-08-25 NOTE — Progress Notes (Signed)
[facility] Progress Note Day of Surgery  Subjective: Patient scheduled for ERCP at 1:00 today.  Feels fine   Objective: Vital signs in last 24 hours: Temp:  [97.9 F (36.6 C)-98.1 F (36.7 C)] 97.9 F (36.6 C) (06/04 0500) Pulse Rate:  [67-72] 72 (06/04 0500) Resp:  [18] 18 (06/04 0500) BP: (98-144)/(59-95) 98/59 (06/04 0500) SpO2:  [99 %-100 %] 99 % (06/04 0500) Last BM Date: 08/23/16  Intake/Output from previous day: 06/03 0701 - 06/04 0700 In: 4435.4 [P.O.:2300; I.V.:2135.4] Out: 1100 [Urine:1100] Intake/Output this shift: No intake/output data recorded.  General: No acute distress  Lungs: Clear  Abd: Soft, good bowel sounds.  LFTs have improved.  Extremities: No changes.  Neuro: Intact  Lab Results:  @LABLAST2 (wbc:2,hgb:2,hct:2,plt:2) BMET ) Recent Labs  08/24/16 0408 08/25/16 0523  NA 136 137  K 3.3* 3.3*  CL 101 106  CO2 20* 22  GLUCOSE 64* 80  BUN <5* <5*  CREATININE 0.79 0.70  CALCIUM 8.9 8.8*   PT/INR No results for input(s): LABPROT, INR in the last 72 hours. ABG No results for input(s): PHART, HCO3 in the last 72 hours.  Invalid input(s): PCO2, PO2  Studies/Results: [place] Abdomen Limited  Result Date: 08/23/2016 CLINICAL DATA:  Right upper quadrant pain, chest pain, nausea, vomiting EXAM: US ABDOMEN LIMITED - RIGHT UPPER QUADRANT COMPARISON:  None. FINDINGS: Gallbladder: Multiple gallstones, the largest 9 mm. Summer noted in the region of the gallbladder neck. No wall thickening or sonographic Murphy's. Common bile duct: Diameter: Dilated common bile duct measuring up to 13-14 mm. The distal duct cannot be visualized due to overlying bowel gas. Liver: No focal lesion identified. Within normal limits in parenchymal echogenicity. IMPRESSION: Cholelithiasis. Dilated common bile duct measuring up to 14 mm. The distal duct cannot be visualized, but the appearance is concerning for possible obstructing distal CBD stone. This could be further evaluated with  MRCP or ERCP. Electronically Signed   By: Charlett NoseKevin  Dover M.D.   On: 08/23/2016 23:32    Anti-infectives: Anti-infectives    None      Assessment/Plan: s/p Procedure(s): ENDOSCOPIC RETROGRADE CHOLANGIOPANCREATOGRAPHY (ERCP) [place] ERDP today.  Improved LFTs. On the schedule for tomorrow for Lap Chole  LOS: 1 day   Marta LamasJames O. Gae BonWyatt, III, MD, FACS [phone]--pager [phone]--[facility] 08/25/2016

## 2016-08-25 NOTE — Anesthesia Procedure Notes (Signed)
Procedure Name: Intubation Date/Time: 08/25/2016 1:40 PM Performed by: Sampson Si E Pre-anesthesia Checklist: Patient identified, Emergency Drugs available, Suction available and Patient being monitored Patient Re-evaluated:Patient Re-evaluated prior to inductionOxygen Delivery Method: Circle System Utilized Preoxygenation: Pre-oxygenation with 100% oxygen Intubation Type: IV induction Ventilation: Mask ventilation without difficulty Laryngoscope Size: Mac and 3 Grade View: Grade I Tube type: Oral Tube size: 7.0 mm Number of attempts: 1 Airway Equipment and Method: Stylet and Oral airway Placement Confirmation: ETT inserted through vocal cords under direct vision,  positive ETCO2 and breath sounds checked- equal and bilateral Secured at: 21 cm Tube secured with: Tape Dental Injury: Teeth and Oropharynx as per pre-operative assessment

## 2016-08-25 NOTE — Transfer of Care (Signed)
Immediate Anesthesia Transfer of Care Note  Patient: Jennifer Guzman  Procedure(s) Performed: Procedure(s): ENDOSCOPIC RETROGRADE CHOLANGIOPANCREATOGRAPHY (ERCP) (N/A)  Patient Location: Endoscopy Unit  Anesthesia Type:General  Level of Consciousness: awake  Airway & Oxygen Therapy: Patient Spontanous Breathing  Post-op Assessment: Report given to RN  Post vital signs: Reviewed and stable  Last Vitals:  Vitals:   08/25/16 0500 08/25/16 1259  BP: (!) 98/59 107/86  Pulse: 72   Resp: 18 (!) 23  Temp: 36.6 C 37 C    Last Pain:  Vitals:   08/25/16 1259  TempSrc: Oral  PainSc:       Patients Stated Pain Goal: 3 (08/24/16 0537)  Complications: No apparent anesthesia complications

## 2016-08-26 ENCOUNTER — Inpatient Hospital Stay (HOSPITAL_COMMUNITY): Payer: Self-pay | Admitting: Certified Registered Nurse Anesthetist

## 2016-08-26 ENCOUNTER — Encounter (HOSPITAL_COMMUNITY)
Admission: EM | Disposition: A | Discharge status: Home / Self Care | Payer: Self-pay | Source: Home / Self Care | Attending: Family Medicine

## 2016-08-26 ENCOUNTER — Encounter (HOSPITAL_COMMUNITY): Payer: Self-pay | Admitting: Anesthesiology

## 2016-08-26 DIAGNOSIS — K8001 Calculus of gallbladder with acute cholecystitis with obstruction: Secondary | ICD-10-CM

## 2016-08-26 DIAGNOSIS — E876 Hypokalemia: Secondary | ICD-10-CM

## 2016-08-26 HISTORY — PX: CHOLECYSTECTOMY: SHX55

## 2016-08-26 LAB — BASIC METABOLIC PANEL
Anion gap: 9 (ref 5–15)
BUN: <5 mg/dL — ABNORMAL LOW (ref 6–20)
CALCIUM: 8.6 mg/dL — AB (ref 8.9–10.3)
CHLORIDE: 106 mmol/L (ref 101–111)
CO2: 23 mmol/L (ref 22–32)
CREATININE: 0.69 mg/dL (ref 0.44–1.00)
GFR calc Af Amer: >60 mL/min (ref >60)
GFR calc non Af Amer: >60 mL/min (ref >60)
Glucose, Bld: 79 mg/dL (ref 65–99)
Potassium: 3.6 mmol/L (ref 3.5–5.1)
SODIUM: 138 mmol/L (ref 135–145)

## 2016-08-26 LAB — MAGNESIUM: Magnesium: 1.5 mg/dL — ABNORMAL LOW (ref 1.7–2.4)

## 2016-08-26 LAB — SURGICAL PCR SCREEN
MRSA, PCR: NEGATIVE
Staphylococcus aureus: NEGATIVE

## 2016-08-26 SURGERY — LAPAROSCOPIC CHOLECYSTECTOMY WITH INTRAOPERATIVE CHOLANGIOGRAM
Anesthesia: General

## 2016-08-26 MED ORDER — LIDOCAINE HCL (CARDIAC) 20 MG/ML IV SOLN
INTRAVENOUS | Status: DC | PRN
  Administered 2016-08-26: 80 mg via INTRAVENOUS

## 2016-08-26 MED ORDER — PHENYLEPHRINE HCL 10 MG/ML IJ SOLN
INTRAMUSCULAR | Status: DC | PRN
  Administered 2016-08-26: 80 ug via INTRAVENOUS
  Administered 2016-08-26: 40 ug via INTRAVENOUS

## 2016-08-26 MED ORDER — ROCURONIUM BROMIDE 100 MG/10ML IV SOLN
INTRAVENOUS | Status: DC | PRN
  Administered 2016-08-26: 20 mg via INTRAVENOUS
  Administered 2016-08-26: 30 mg via INTRAVENOUS
  Administered 2016-08-26: 10 mg via INTRAVENOUS

## 2016-08-26 MED ORDER — SODIUM CHLORIDE 0.9 % IV SOLN
INTRAVENOUS | Status: DC
  Administered 2016-08-26: 13:00:00 via INTRAVENOUS

## 2016-08-26 MED ORDER — MEPERIDINE HCL 25 MG/ML IJ SOLN
6.2500 mg | INTRAMUSCULAR | Status: DC | PRN
Start: 2016-08-26 — End: 2016-08-26

## 2016-08-26 MED ORDER — SUGAMMADEX SODIUM 200 MG/2ML IV SOLN
INTRAVENOUS | Status: DC | PRN
  Administered 2016-08-26: 250 mg via INTRAVENOUS

## 2016-08-26 MED ORDER — BUPIVACAINE-EPINEPHRINE 0.25% -1:200000 IJ SOLN
INTRAMUSCULAR | Status: DC | PRN
  Administered 2016-08-26: 30 mL

## 2016-08-26 MED ORDER — DEXAMETHASONE SODIUM PHOSPHATE 10 MG/ML IJ SOLN
INTRAMUSCULAR | Status: DC | PRN
  Administered 2016-08-26: 4 mg via INTRAVENOUS

## 2016-08-26 MED ORDER — FENTANYL CITRATE (PF) 250 MCG/5ML IJ SOLN
INTRAMUSCULAR | Status: AC
  Filled 2016-08-26: qty 5

## 2016-08-26 MED ORDER — IOPAMIDOL (ISOVUE-300) INJECTION 61%
INTRAVENOUS | Status: DC | PRN
  Administered 2016-08-26: 10:00:00

## 2016-08-26 MED ORDER — ONDANSETRON HCL 4 MG/2ML IJ SOLN
INTRAMUSCULAR | Status: AC
  Filled 2016-08-26: qty 2

## 2016-08-26 MED ORDER — SUCCINYLCHOLINE CHLORIDE 20 MG/ML IJ SOLN
INTRAMUSCULAR | Status: DC | PRN
  Administered 2016-08-26: 140 mg via INTRAVENOUS

## 2016-08-26 MED ORDER — SUGAMMADEX SODIUM 500 MG/5ML IV SOLN
INTRAVENOUS | Status: AC
  Filled 2016-08-26: qty 5

## 2016-08-26 MED ORDER — LACTATED RINGERS IV SOLN
INTRAVENOUS | Status: DC | PRN
  Administered 2016-08-26 (×2): via INTRAVENOUS

## 2016-08-26 MED ORDER — SODIUM CHLORIDE 0.9 % IV SOLN: 3.0000 g | INTRAVENOUS | Status: DC

## 2016-08-26 MED ORDER — HYDROMORPHONE HCL 1 MG/ML IJ SOLN
0.2500 mg | INTRAMUSCULAR | Status: DC | PRN
  Administered 2016-08-26: 0.5 mg via INTRAVENOUS

## 2016-08-26 MED ORDER — IOPAMIDOL (ISOVUE-300) INJECTION 61%
INTRAVENOUS | Status: AC
  Filled 2016-08-26: qty 50

## 2016-08-26 MED ORDER — PHENYLEPHRINE 40 MCG/ML (10ML) SYRINGE FOR IV PUSH (FOR BLOOD PRESSURE SUPPORT)
PREFILLED_SYRINGE | INTRAVENOUS | Status: AC
  Filled 2016-08-26: qty 10

## 2016-08-26 MED ORDER — METOCLOPRAMIDE HCL 5 MG/ML IJ SOLN
INTRAMUSCULAR | Status: AC
  Administered 2016-08-26: 10 mg via INTRAVENOUS
  Filled 2016-08-26: qty 2

## 2016-08-26 MED ORDER — MIDAZOLAM HCL 5 MG/5ML IJ SOLN
INTRAMUSCULAR | Status: DC | PRN
  Administered 2016-08-26: 2 mg via INTRAVENOUS

## 2016-08-26 MED ORDER — SUGAMMADEX SODIUM 200 MG/2ML IV SOLN
INTRAVENOUS | Status: AC
  Filled 2016-08-26: qty 2

## 2016-08-26 MED ORDER — ONDANSETRON HCL 4 MG/2ML IJ SOLN
INTRAMUSCULAR | Status: DC | PRN
  Administered 2016-08-26: 4 mg via INTRAVENOUS

## 2016-08-26 MED ORDER — MIDAZOLAM HCL 2 MG/2ML IJ SOLN
INTRAMUSCULAR | Status: AC
  Filled 2016-08-26: qty 2

## 2016-08-26 MED ORDER — PROPOFOL 10 MG/ML IV BOLUS
INTRAVENOUS | Status: AC
  Filled 2016-08-26: qty 40

## 2016-08-26 MED ORDER — SODIUM CHLORIDE 0.9 % IV SOLN
3.0000 g | INTRAVENOUS | Status: AC
  Administered 2016-08-26: 3 g via INTRAVENOUS
  Filled 2016-08-26: qty 3

## 2016-08-26 MED ORDER — 0.9 % SODIUM CHLORIDE (POUR BTL) OPTIME
TOPICAL | Status: DC | PRN
  Administered 2016-08-26: 1000 mL

## 2016-08-26 MED ORDER — HYDROMORPHONE HCL 1 MG/ML IJ SOLN
INTRAMUSCULAR | Status: AC
  Administered 2016-08-26: 0.5 mg via INTRAVENOUS
  Filled 2016-08-26: qty 0.5

## 2016-08-26 MED ORDER — DEXAMETHASONE SODIUM PHOSPHATE 10 MG/ML IJ SOLN
INTRAMUSCULAR | Status: AC
  Filled 2016-08-26: qty 1

## 2016-08-26 MED ORDER — METOCLOPRAMIDE HCL 5 MG/ML IJ SOLN
10.0000 mg | Freq: Once | INTRAMUSCULAR | Status: AC | PRN
  Administered 2016-08-26: 10 mg via INTRAVENOUS

## 2016-08-26 MED ORDER — PROPOFOL 10 MG/ML IV BOLUS
INTRAVENOUS | Status: DC | PRN
  Administered 2016-08-26: 200 mg via INTRAVENOUS

## 2016-08-26 MED ORDER — BUPIVACAINE-EPINEPHRINE (PF) 0.25% -1:200000 IJ SOLN
INTRAMUSCULAR | Status: AC
  Filled 2016-08-26: qty 30

## 2016-08-26 MED ORDER — LIDOCAINE 2% (20 MG/ML) 5 ML SYRINGE
INTRAMUSCULAR | Status: AC
  Filled 2016-08-26: qty 5

## 2016-08-26 MED ORDER — FENTANYL CITRATE (PF) 100 MCG/2ML IJ SOLN
INTRAMUSCULAR | Status: DC | PRN
  Administered 2016-08-26: 100 ug via INTRAVENOUS
  Administered 2016-08-26 (×2): 50 ug via INTRAVENOUS
  Administered 2016-08-26: 100 ug via INTRAVENOUS

## 2016-08-26 MED ORDER — OXYCODONE-ACETAMINOPHEN 5-325 MG PO TABS
1.0000 | ORAL_TABLET | ORAL | Status: DC | PRN
  Administered 2016-08-26 – 2016-08-27 (×3): 2 via ORAL
  Filled 2016-08-26 (×3): qty 2

## 2016-08-26 MED ORDER — FENTANYL CITRATE (PF) 250 MCG/5ML IJ SOLN
INTRAMUSCULAR | Status: AC
Start: 2016-08-26 — End: 2016-08-26
  Filled 2016-08-26: qty 5

## 2016-08-26 MED ORDER — ROCURONIUM BROMIDE 10 MG/ML (PF) SYRINGE
PREFILLED_SYRINGE | INTRAVENOUS | Status: AC
  Filled 2016-08-26: qty 5

## 2016-08-26 MED ORDER — SODIUM CHLORIDE 0.9 % IR SOLN
Status: DC | PRN
Start: 2016-08-26 — End: 2016-08-26
  Administered 2016-08-26: 1000 mL

## 2016-08-26 SURGICAL SUPPLY — 46 items
APPLIER CLIP 5 13 M/L LIGAMAX5 (MISCELLANEOUS) ×3
BLADE CLIPPER SURG (BLADE) IMPLANT
CANISTER SUCT 3000ML PPV (MISCELLANEOUS) ×3 IMPLANT
CHLORAPREP W/TINT 26ML (MISCELLANEOUS) ×3 IMPLANT
CLIP APPLIE 5 13 M/L LIGAMAX5 (MISCELLANEOUS) ×1 IMPLANT
CLOSURE WOUND 1/2 X4 (GAUZE/BANDAGES/DRESSINGS) ×1
COVER MAYO STAND STRL (DRAPES) IMPLANT
COVER SURGICAL LIGHT HANDLE (MISCELLANEOUS) ×3 IMPLANT
DERMABOND ADVANCED (GAUZE/BANDAGES/DRESSINGS) ×2
DERMABOND ADVANCED .7 DNX12 (GAUZE/BANDAGES/DRESSINGS) ×1 IMPLANT
DRAPE C-ARM 42X72 X-RAY (DRAPES) IMPLANT
DRSG TEGADERM 2-3/8X2-3/4 SM (GAUZE/BANDAGES/DRESSINGS) ×3 IMPLANT
ELECT REM PT RETURN 9FT ADLT (ELECTROSURGICAL) ×3
ELECTRODE REM PT RTRN 9FT ADLT (ELECTROSURGICAL) ×1 IMPLANT
GLOVE BIOGEL PI IND STRL 7.0 (GLOVE) ×1 IMPLANT
GLOVE BIOGEL PI IND STRL 8 (GLOVE) ×2 IMPLANT
GLOVE BIOGEL PI INDICATOR 7.0 (GLOVE) ×2
GLOVE BIOGEL PI INDICATOR 8 (GLOVE) ×4
GLOVE ECLIPSE 7.5 STRL STRAW (GLOVE) ×3 IMPLANT
GLOVE SURG SS PI 8.0 STRL IVOR (GLOVE) ×3 IMPLANT
GOWN STRL REUS W/ TWL LRG LVL3 (GOWN DISPOSABLE) ×2 IMPLANT
GOWN STRL REUS W/ TWL XL LVL3 (GOWN DISPOSABLE) ×1 IMPLANT
GOWN STRL REUS W/TWL LRG LVL3 (GOWN DISPOSABLE) ×4
GOWN STRL REUS W/TWL XL LVL3 (GOWN DISPOSABLE) ×2
KIT BASIN OR (CUSTOM PROCEDURE TRAY) ×3 IMPLANT
KIT ROOM TURNOVER OR (KITS) ×3 IMPLANT
L-HOOK LAP DISP 36CM (ELECTROSURGICAL) ×3
LHOOK LAP DISP 36CM (ELECTROSURGICAL) ×1 IMPLANT
NS IRRIG 1000ML POUR BTL (IV SOLUTION) ×3 IMPLANT
PAD ARMBOARD 7.5X6 YLW CONV (MISCELLANEOUS) ×3 IMPLANT
PENCIL BUTTON HOLSTER BLD 10FT (ELECTRODE) ×3 IMPLANT
POUCH RETRIEVAL ECOSAC 10 (ENDOMECHANICALS) ×1 IMPLANT
POUCH RETRIEVAL ECOSAC 10MM (ENDOMECHANICALS) ×2
SCISSORS LAP 5X35 DISP (ENDOMECHANICALS) ×3 IMPLANT
SET CHOLANGIOGRAPH 5 50 .035 (SET/KITS/TRAYS/PACK) IMPLANT
SET IRRIG TUBING LAPAROSCOPIC (IRRIGATION / IRRIGATOR) ×3 IMPLANT
SLEEVE ENDOPATH XCEL 5M (ENDOMECHANICALS) ×6 IMPLANT
SPECIMEN JAR SMALL (MISCELLANEOUS) ×3 IMPLANT
STRIP CLOSURE SKIN 1/2X4 (GAUZE/BANDAGES/DRESSINGS) ×2 IMPLANT
SUT MNCRL AB 4-0 PS2 18 (SUTURE) ×3 IMPLANT
TOWEL OR 17X24 6PK STRL BLUE (TOWEL DISPOSABLE) ×3 IMPLANT
TOWEL OR 17X26 10 PK STRL BLUE (TOWEL DISPOSABLE) ×3 IMPLANT
TRAY LAPAROSCOPIC MC (CUSTOM PROCEDURE TRAY) ×3 IMPLANT
TROCAR XCEL BLUNT TIP 100MML (ENDOMECHANICALS) ×3 IMPLANT
TROCAR XCEL NON-BLD 5MMX100MML (ENDOMECHANICALS) ×3 IMPLANT
TUBING INSUFFLATION (TUBING) ×3 IMPLANT

## 2016-08-26 NOTE — Anesthesia Preprocedure Evaluation (Signed)
Anesthesia Evaluation  Patient identified by MRN, date of birth, ID band Patient awake    Reviewed: Allergy & Precautions, NPO status , Patient's Chart, lab work & pertinent test results  Airway Mallampati: I  TM Distance: >3 FB Neck ROM: Full    Dental no notable dental hx. (+) Teeth Intact   Pulmonary neg pulmonary ROS,    Pulmonary exam normal breath sounds clear to auscultation       Cardiovascular negative cardio ROS Normal cardiovascular exam Rhythm:Regular Rate:Normal     Neuro/Psych negative neurological ROS  negative psych ROS   GI/Hepatic Elevated LFT's Choledocholithiasis and Cholelithiasis   Endo/Other  Morbid obesity  Renal/GU negative Renal ROS  negative genitourinary   Musculoskeletal negative musculoskeletal ROS (+)   Abdominal (+) + obese,   Peds  Hematology negative hematology ROS (+)   Anesthesia Other Findings   Reproductive/Obstetrics negative OB ROS                             Anesthesia Physical Anesthesia Plan  ASA: III  Anesthesia Plan: General   Post-op Pain Management:    Induction: Intravenous and Cricoid pressure planned  PONV Risk Score and Plan: 4 or greater and Ondansetron, Dexamethasone, Propofol, Midazolam, Scopolamine patch - Pre-op and Treatment may vary due to age  Airway Management Planned: Oral ETT  Additional Equipment:   Intra-op Plan:   Post-operative Plan: Extubation in OR  Informed Consent: I have reviewed the patients History and Physical, chart, labs and discussed the procedure including the risks, benefits and alternatives for the proposed anesthesia with the patient or authorized representative who has indicated his/her understanding and acceptance.   Dental advisory given  Plan Discussed with: CRNA, Anesthesiologist and Surgeon  Anesthesia Plan Comments:         Anesthesia Quick Evaluation

## 2016-08-26 NOTE — Progress Notes (Signed)
Patient doing well after ERCP yesterday where two stones were removed from the CBD.  For Lap Cholecystectomy with possible IOC today.  Marta LamasJames O. Gae BonWyatt, III, MD, FACS [phone]--pager [phone]--office [facility]

## 2016-08-26 NOTE — Transfer of Care (Signed)
Immediate Anesthesia Transfer of Care Note  Patient: Jennifer Guzman  Procedure(s) Performed: Procedure(s): LAPAROSCOPIC CHOLECYSTECTOMY (N/A)  Patient Location: PACU  Anesthesia Type:General  Level of Consciousness: awake, alert  and oriented  Airway & Oxygen Therapy: Patient Spontanous Breathing and Patient connected to nasal cannula oxygen  Post-op Assessment: Report given to RN and Post -op Vital signs reviewed and stable  Post vital signs: Reviewed and stable  Last Vitals:  Vitals:   08/26/16 0459 08/26/16 0503  BP: (!) 88/47 118/61  Pulse: 82 84  Resp: 17   Temp: 36.8 C     Last Pain:  Vitals:   08/26/16 0459  TempSrc: Oral  PainSc:       Patients Stated Pain Goal: 3 (08/24/16 0537)  Complications: No apparent anesthesia complications

## 2016-08-26 NOTE — Progress Notes (Addendum)
Reason for Consult: abdominal pain Referring Physician: hospitalist  Jennifer Guzman is an 26 y.o. female.  HPI: 26 yo female with right upper quadrant pain. She was admitted to the hospital yesterday. Her pain is a little better now. She has had symptoms for the last 4 months and saw a Psychologist, sport and exercise but was told to wait. She had nausea overnight. She was diagnosed with gallstones and CBD dilation.   History reviewed. No pertinent past medical history.  History reviewed. No pertinent surgical history.  Family History  Problem Relation Age of Onset  . Heart disease Mother   . Diabetes Maternal Grandmother     Social History:  reports that she has never smoked. She has never used smokeless tobacco. She reports that she does not drink alcohol or use drugs.  Allergies:  Allergies  Allergen Reactions  . No Known Allergies     Medications: I have reviewed the patient's current medications.  Results for orders placed or performed during the hospital encounter of 08/23/16 (from the past 48 hour(s))  CBC     Status: None   Collection Time: 08/25/16  5:23 AM  Result Value Ref Range   WBC 4.3 4.0 - 10.5 K/uL   RBC 4.25 3.87 - 5.11 MIL/uL   Hemoglobin 12.5 12.0 - 15.0 g/dL   HCT 37.1 36.0 - 46.0 %   MCV 87.3 78.0 - 100.0 fL   MCH 29.4 26.0 - 34.0 pg   MCHC 33.7 30.0 - 36.0 g/dL   RDW 13.3 11.5 - 15.5 %   Platelets 188 150 - 400 K/uL  Comprehensive metabolic panel     Status: Abnormal   Collection Time: 08/25/16  5:23 AM  Result Value Ref Range   Sodium 137 135 - 145 mmol/L   Potassium 3.3 (L) 3.5 - 5.1 mmol/L   Chloride 106 101 - 111 mmol/L   CO2 22 22 - 32 mmol/L   Glucose, Bld 80 65 - 99 mg/dL   BUN <5 (L) 6 - 20 mg/dL   Creatinine, Ser 0.70 0.44 - 1.00 mg/dL   Calcium 8.8 (L) 8.9 - 10.3 mg/dL   Total Protein 6.1 (L) 6.5 - 8.1 g/dL   Albumin 3.3 (L) 3.5 - 5.0 g/dL   AST 64 (H) 15 - 41 U/L   ALT 165 (H) 14 - 54 U/L   Alkaline Phosphatase 104 38 - 126 U/L   Total Bilirubin 2.9  (H) 0.3 - 1.2 mg/dL   GFR calc non Af Amer >60 >60 mL/min   GFR calc Af Amer >60 >60 mL/min    Comment: (NOTE) The eGFR has been calculated using the CKD EPI equation. This calculation has not been validated in all clinical situations. eGFR's persistently <60 mL/min signify possible Chronic Kidney Disease.    Anion gap 9 5 - 15  Surgical pcr screen     Status: None   Collection Time: 08/25/16  9:52 PM  Result Value Ref Range   MRSA, PCR NEGATIVE NEGATIVE   Staphylococcus aureus NEGATIVE NEGATIVE    Comment:        The Xpert SA Assay (FDA approved for NASAL specimens in patients over 71 years of age), is one component of a comprehensive surveillance program.  Test performance has been validated by [facility] for patients greater than or equal to 85 year old. It is not intended to diagnose infection nor to guide or monitor treatment.   Basic metabolic panel     Status: Abnormal   Collection  Time: 08/26/16  5:18 AM  Result Value Ref Range   Sodium 138 135 - 145 mmol/L   Potassium 3.6 3.5 - 5.1 mmol/L   Chloride 106 101 - 111 mmol/L   CO2 23 22 - 32 mmol/L   Glucose, Bld 79 65 - 99 mg/dL   BUN <5 (L) 6 - 20 mg/dL   Creatinine, Ser 0.69 0.44 - 1.00 mg/dL   Calcium 8.6 (L) 8.9 - 10.3 mg/dL   GFR calc non Af Amer >60 >60 mL/min   GFR calc Af Amer >60 >60 mL/min    Comment: (NOTE) The eGFR has been calculated using the CKD EPI equation. This calculation has not been validated in all clinical situations. eGFR's persistently <60 mL/min signify possible Chronic Kidney Disease.    Anion gap 9 5 - 15  Magnesium     Status: Abnormal   Collection Time: 08/26/16  5:18 AM  Result Value Ref Range   Magnesium 1.5 (L) 1.7 - 2.4 mg/dL    Dg Ercp Biliary & Pancreatic Ducts  Result Date: 08/25/2016 CLINICAL DATA:  Biliary obstruction and choledocholithiasis. EXAM: ERCP TECHNIQUE: Multiple spot images obtained with the fluoroscopic device and submitted for interpretation  post-procedure. COMPARISON:  Abdominal ultrasound on 08/23/2016 FINDINGS: Fluoroscopic images demonstrate cannulation of the common bile duct with contrast injection demonstrating visible filling defects within the distal common bile duct. The common bile duct is diffusely dilated. After sphincterotomy, a balloon sweep maneuver was performed to extract calculi. Completion cholangiogram demonstrates no evidence of persistent filling defects. IMPRESSION: ERCP images demonstrate calculi within the distal common bile duct with associated common duct dilatation. A balloon sweep maneuver was performed to extract calculi and completion cholangiography demonstrates no persistent filling defects. These images were submitted for radiologic interpretation only. Please see the procedural report for the amount of contrast and the fluoroscopy time utilized. Electronically Signed   By: Aletta Edouard M.D.   On: 08/25/2016 16:15    Review of Systems  Constitutional: Negative for chills and fever.  HENT: Negative for hearing loss.   Eyes: Negative for blurred vision and double vision.  Respiratory: Negative for cough and hemoptysis.   Cardiovascular: Negative for chest pain and palpitations.  Gastrointestinal: Positive for abdominal pain, nausea and vomiting.  Genitourinary: Negative for dysuria and urgency.  Musculoskeletal: Negative for myalgias and neck pain.  Skin: Negative for itching and rash.  Neurological: Negative for dizziness, tingling and headaches.  Endo/Heme/Allergies: Does not bruise/bleed easily.  Psychiatric/Behavioral: Negative for depression and suicidal ideas.   Blood pressure 95/71, pulse (!) 104, temperature 97.5 F (36.4 C), resp. rate 13, height '5\' 4"'  (1.626 m), weight 121.8 kg (268 lb 9.6 oz), last menstrual period 08/10/2016, SpO2 98 %. Physical Exam  Vitals reviewed. Constitutional: She is oriented to person, place, and time. She appears well-developed and well-nourished.  HENT:    Head: Normocephalic and atraumatic.  Eyes: Conjunctivae and EOM are normal. Pupils are equal, round, and reactive to light.  Neck: Normal range of motion. Neck supple.  Cardiovascular: Normal rate and regular rhythm.   Respiratory: Effort normal and breath sounds normal.  GI: Soft. Bowel sounds are normal. She exhibits no distension. There is tenderness in the right upper quadrant. There is negative Murphy's sign.  Musculoskeletal: Normal range of motion.  Neurological: She is alert and oriented to person, place, and time.  Skin: Skin is warm and dry.  Psychiatric: She has a normal mood and affect. Her behavior is normal.  Assessment/Plan: 26 yo female with choledocholithiasis -we will continue to follow along -likely lap chole prior to discharge  Arta Bruce Shayann Garbutt 08/26/2016, 5:27 PM

## 2016-08-26 NOTE — Anesthesia Postprocedure Evaluation (Signed)
Anesthesia Post Note  Patient: Geralynn OchsJaclyn M Yebra  Procedure(s) Performed: Procedure(s) (LRB): LAPAROSCOPIC CHOLECYSTECTOMY (N/A)     Patient location during evaluation: PACU Anesthesia Type: General Level of consciousness: awake and alert and oriented Pain management: pain level controlled Vital Signs Assessment: post-procedure vital signs reviewed and stable Respiratory status: spontaneous breathing, nonlabored ventilation and respiratory function stable Cardiovascular status: blood pressure returned to baseline and stable Postop Assessment: no signs of nausea or vomiting Anesthetic complications: no    Last Vitals:  Vitals:   08/26/16 1135 08/26/16 1205  BP:  128/89  Pulse:  82  Resp:  20  Temp: 36.9 C 36.4 C    Last Pain:  Vitals:   08/26/16 1205  TempSrc:   PainSc: 3                  Ethelyne Erich A.

## 2016-08-26 NOTE — Anesthesia Procedure Notes (Signed)
Procedure Name: Intubation Date/Time: 08/26/2016 10:18 AM Performed by: Candis Shine Pre-anesthesia Checklist: Patient identified, Emergency Drugs available, Suction available and Patient being monitored Patient Re-evaluated:Patient Re-evaluated prior to inductionOxygen Delivery Method: Circle System Utilized Preoxygenation: Pre-oxygenation with 100% oxygen Intubation Type: IV induction Ventilation: Mask ventilation without difficulty Laryngoscope Size: Mac and 3 Grade View: Grade I Tube type: Oral Tube size: 7.0 mm Number of attempts: 1 Airway Equipment and Method: Stylet Placement Confirmation: ETT inserted through vocal cords under direct vision,  positive ETCO2 and breath sounds checked- equal and bilateral Secured at: 21 (teeth) cm Tube secured with: Tape Dental Injury: Teeth and Oropharynx as per pre-operative assessment

## 2016-08-26 NOTE — Op Note (Signed)
OPERATIVE REPORT  DATE OF OPERATION:  08/26/2016  PATIENT:  Jennifer BarkerJaclyn M Guzman  26 y.o. female  PRE-OPERATIVE DIAGNOSIS:  cholelithiasis and choledocholithiasis  POST-OPERATIVE DIAGNOSIS:  cholelithiasis and choledocholithiasis  INDICATION(S) FOR OPERATION:  Symptomatic cholelithiasis  FINDINGS:  Chronic cholecystitis.  Large 7-8 mm cystic duct  PROCEDURE:  Procedure(s): LAPAROSCOPIC CHOLECYSTECTOMY  SURGEON:  Surgeon(s): Jimmye NormanWyatt, Severiano Utsey, MD  ASSISTANT: None  ANESTHESIA:   general  COMPLICATIONS:  None  EBL: <20 ml  BLOOD ADMINISTERED: none  DRAINS: none   SPECIMEN:  Source of Specimen:  Gallbladder and contents  COUNTS CORRECT:  YES  PROCEDURE DETAILS: The patient was taken to the operating room and placed on the table in the supine position.  After an adequate endotracheal anesthetic was administered, the patient was prepped with ChloroPrep, and then draped in the usual manner exposing the entire abdomen laterally, inferiorly and up  to the costal margins.  After a proper timeout was performed including identifying the patient and the procedure to be performed, a supraumbilical 1.5cm midline incision was made using a #15 blade.  This was taken down to the fascia which was then incised with a #15 blade.  The edges of the fascia were tented up with Kocher clamps as the preperitoneal space was penetrated with a Kelly clamp into the peritoneum.  Once this was done, a pursestring suture of 0 Vicryl was passed around the fascial opening.  This was subsequently used to secure the [facility] cannula which was passed into the peritoneal cavity.  Once the [facility] cannula was in place, carbon dioxide gas was insufflated into the peritoneal cavity up to a maximal intra-abdominal pressure of 15mm Hg.The laparoscope, with attached camera and light source, was passed into the peritoneal cavity to visualize the direct insertion of two right upper quadrant 5mm cannulas, and a sup-xiphoid 5mm cannula.   Once all cannulas were in place, the dissection was begun.  Two ratcheted graspers were attached to the dome and infundibulum of the gallbladder and retracted towards the anterior abdominal wall and the right upper quadrant.  Using cautery attached to a dissecting forceps, the peritoneum overlaying the triangle of Chalot and the hepatoduodenal triangle was dissected away exposing the cystic duct and the cystic artery.  A critical window was developed between the CBD and the cystic duct The cystic artery was clipped proximally and distally then transected.  A clip was placed on the gallbladder side of the cystic duct, then the distal cystic duct was clipped three times then transected between the clips.  The gallbladder was then dissected out of the hepatic bed without event.  It was retrieved from the abdomen without event.  Once the gallbladder was removed, the bed was inspected for hemostasis.  Once excellent hemostasis was obtained all gas and fluids were aspirated from above the liver, then the cannulas were removed.  The supraumbilical incision was closed using the pursestring suture which was in place.  0.25% bupivicaine with epinephrine was injected at all sites.  All 10mm or greater cannula sites were close using a running subcuticular stitch of 4-0 Monocryl.  5.400mm cannula sites were closed with Dermabond only.Steri-Strips and Tagaderm were used to complete the dressings at all sites.  At this point all needle, sponge, and instrument counts were correct.The patient was awakened from anesthesia and taken to the PACU in stable condition.  Marta LamasJames O. Gae BonWyatt, III, MD, FACS [phone]--pager [phone]--office [facility]  PATIENT DISPOSITION:  PACU - hemodynamically stable.   Jennifer Guzman 6/5/201811:20 AM

## 2016-08-26 NOTE — Progress Notes (Signed)
   Patient Name: Jennifer Guzman Date of Encounter: 08/26/2016, 2:47 PM    Subjective  S/p ERCP yesterday and cholecystectomy this AM. Patient has no specific complaints   Objective  BP 128/89 (BP Location: Right Arm)   Pulse 82   Temp 97.5 F (36.4 C)   Resp 20   Ht 5\' 4"  (1.626 m)   Wt 268 lb 9.6 oz (121.8 kg)   LMP 08/10/2016   SpO2 100%   BMI 46.11 kg/m   NAD s1s2 Abd soft mild tenderness and distension   Assessment and Plan   25 yr F with cholelithiasis, cholecystitis and choledocholithiasis s/p ERCP and cholecystectomy Pain control per primary Follow up with surgery post op GI follow up as needed Please call with any questions   Iona BeardK. Veena Vernadette Stutsman , MD [phone] Mon-Fri 8a-5p [phone] after 5p, weekends, holidays

## 2016-08-26 NOTE — Progress Notes (Signed)
[facility] PROGRESS NOTE  Brief Narrative: Jennifer Guzman is a 26 y.o. female with a history of symptomatic cholelithiasis who presented to the ED with recurrent RUQ pain. She describes "attacks" of severe pain in the right upper abdomen radiating to the back  intermittently reliably after food intake, worse with fatty foods, worsening over the past week, becoming associated with nausea and vomiting. She has been under the care of general surgery, monitoring her clinically. On arrival she was afebrile in no distress with RUQ tenderness. Abd U/S showed cholelithiasis without cholecystitis, and CBD dilation suggestive of choledocholithiasis. TBili 6.6, AST 86, ALT 242, Alk Phos 128. Normal CBC, negative UPT. GI was consulted performing ERCP 6/4. Laparoscopic cholecystectomy for chronic cholecystitis was performed 6/5.   Subjective: Abdominal pain "bloating" tolerable postop. No nausea. Tolerated jello for now.  Objective: BP 128/89 (BP Location: Right Arm)   Pulse 82   Temp 97.5 F (36.4 C)   Resp 20   Ht '5\' 4"'  (1.626 m)   Wt 121.8 kg (268 lb 9.6 oz)   LMP 08/10/2016   SpO2 100%   BMI 46.11 kg/m  Gen: well-appearing 26 y.o.female in NAD Pulm: Non-labored; CTAB, no wheezes  CV: Regular rate, no murmur; no LE edema GI: + BS; soft, obese, appropriate tenderness postop, non-distended  Skin: Laparoscopic sites c/d/i  Assessment & Plan: Choledocholithiasis: LFTs trending downward.  - ERCP 6/4, Lap chole 6/5. Received perioperative unasyn.  - IVF's, advance diet (clear liquids > regular) as tolerated per surgery - Oxycodone prn pain per surgery.   Hypokalemia: Acute, mild. Suspected due to GI losses. Resolved. - Recheck CMP in AM  Vance Gather, MD [facility] Pager [phone]

## 2016-08-27 ENCOUNTER — Encounter (HOSPITAL_COMMUNITY): Payer: Self-pay | Admitting: General Surgery

## 2016-08-27 LAB — CBC
HCT: 35.5 % — ABNORMAL LOW (ref 36.0–46.0)
Hemoglobin: 11.8 g/dL — ABNORMAL LOW (ref 12.0–15.0)
MCH: 29.3 pg (ref 26.0–34.0)
MCHC: 33.2 g/dL (ref 30.0–36.0)
MCV: 88.1 fL (ref 78.0–100.0)
Platelets: 181 10*3/uL (ref 150–400)
RBC: 4.03 MIL/uL (ref 3.87–5.11)
RDW: 13.4 % (ref 11.5–15.5)
WBC: 8.2 10*3/uL (ref 4.0–10.5)

## 2016-08-27 LAB — COMPREHENSIVE METABOLIC PANEL WITH GFR
ALT: 157 U/L — ABNORMAL HIGH (ref 14–54)
AST: 75 U/L — ABNORMAL HIGH (ref 15–41)
Albumin: 3.1 g/dL — ABNORMAL LOW (ref 3.5–5.0)
Alkaline Phosphatase: 78 U/L (ref 38–126)
Anion gap: 11 (ref 5–15)
BUN: <5 mg/dL — ABNORMAL LOW (ref 6–20)
CO2: 22 mmol/L (ref 22–32)
Calcium: 8.6 mg/dL — ABNORMAL LOW (ref 8.9–10.3)
Chloride: 103 mmol/L (ref 101–111)
Creatinine, Ser: 0.68 mg/dL (ref 0.44–1.00)
GFR calc Af Amer: >60 mL/min
GFR calc non Af Amer: >60 mL/min
Glucose, Bld: 87 mg/dL (ref 65–99)
Potassium: 3.4 mmol/L — ABNORMAL LOW (ref 3.5–5.1)
Sodium: 136 mmol/L (ref 135–145)
Total Bilirubin: 1.6 mg/dL — ABNORMAL HIGH (ref 0.3–1.2)
Total Protein: 5.7 g/dL — ABNORMAL LOW (ref 6.5–8.1)

## 2016-08-27 MED ORDER — ENOXAPARIN SODIUM 40 MG/0.4ML ~~LOC~~ SOLN: 40.0000 mg | SUBCUTANEOUS | Status: DC

## 2016-08-27 MED ORDER — OXYCODONE-ACETAMINOPHEN 5-325 MG PO TABS: 1.0000 | ORAL_TABLET | Freq: Four times a day (QID) | ORAL | 0 refills | Status: DC | PRN

## 2016-08-27 NOTE — Progress Notes (Signed)
Jennifer BarkerJaclyn M Guzman to be D/C'd Home per MD order.  Discussed with the patient and all questions fully answered.  VSS, Skin clean, dry and intact without evidence of skin break down, no evidence of skin tears noted. IV catheter discontinued intact. Site without signs and symptoms of complications. Dressing and pressure applied.  An After Visit Summary was printed and given to the patient. Patient received prescription.  D/c education completed with patient/family including follow up instructions, medication list, d/c activities limitations if indicated, with other d/c instructions as indicated by MD - patient able to verbalize understanding, all questions fully answered.   Patient instructed to return to ED, call 911, or call MD for any changes in condition.   Patient escorted via WC, and D/C home via private auto. Allergies as of 08/27/2016      Reactions   No Known Allergies       Medication List    TAKE these medications   clonazePAM 0.5 MG tablet Commonly known as:  KLONOPIN Take 1 tablet (0.5 mg total) by mouth 2 (two) times daily as needed for anxiety.   ibuprofen 200 MG tablet Commonly known as:  ADVIL,MOTRIN Take 400 mg by mouth every 6 (six) hours as needed (for pain).   oxyCODONE-acetaminophen 5-325 MG tablet Commonly known as:  PERCOCET/ROXICET Take 1-2 tablets by mouth every 6 (six) hours as needed for moderate pain.      Jennifer HarrisonSamantha K Davionne Guzman 08/27/2016 12:37 PM

## 2016-08-27 NOTE — Progress Notes (Signed)
Patient ID: Jennifer OchsJaclyn M Crownover, female   DOB: 02/23/1991, 26 y.o.   MRN: [id number]  [facility]  1 Day Post-Op  Subjective: CC- Cholelithiasis and choledocholithiasis Walking around room. Sore but overall pain improved. Tolerating regular diet. Denies n/v.   Objective: Vital signs in last 24 hours: Temp:  [97.5 F (36.4 C)-98.5 F (36.9 C)] 98.3 F (36.8 C) (06/06 0447) Pulse Rate:  [79-112] 95 (06/06 0447) Resp:  [13-20] 18 (06/06 0447) BP: (95-140)/(69-89) 129/70 (06/06 0447) SpO2:  [97 %-100 %] 99 % (06/06 0447) Last BM Date: 08/25/16  Intake/Output from previous day: 06/05 0701 - 06/06 0700 In: 1991.3 [P.O.:480; I.V.:1511.3] Out: 650 [Urine:650] Intake/Output this shift: No intake/output data recorded.  PE: Gen:  Alert, NAD, pleasant Pulm:  CTAB, no W/R/R, effort normal Abd: obese, soft, ND, appropriately tender, +BS, no HSM, no hernia, incisions C/D/I  Lab Results:   Recent Labs  08/25/16 0523 08/27/16 0448  WBC 4.3 8.2  HGB 12.5 11.8*  HCT 37.1 35.5*  PLT 188 181   BMET  Recent Labs  08/26/16 0518 08/27/16 0448  NA 138 136  K 3.6 3.4*  CL 106 103  CO2 23 22  GLUCOSE 79 87  BUN <5* <5*  CREATININE 0.69 0.68  CALCIUM 8.6* 8.6*   PT/INR No results for input(s): LABPROT, INR in the last 72 hours. CMP     Component Value Date/Time   NA 136 08/27/2016 0448   K 3.4 (L) 08/27/2016 0448   CL 103 08/27/2016 0448   CO2 22 08/27/2016 0448   GLUCOSE 87 08/27/2016 0448   BUN <5 (L) 08/27/2016 0448   CREATININE 0.68 08/27/2016 0448   CREATININE 0.78 01/23/2016 1716   CALCIUM 8.6 (L) 08/27/2016 0448   PROT 5.7 (L) 08/27/2016 0448   ALBUMIN 3.1 (L) 08/27/2016 0448   AST 75 (H) 08/27/2016 0448   ALT 157 (H) 08/27/2016 0448   ALKPHOS 78 08/27/2016 0448   BILITOT 1.6 (H) 08/27/2016 0448   GFRNONAA >60 08/27/2016 0448   GFRNONAA >89 01/23/2016 1716   GFRAA >60 08/27/2016 0448   GFRAA >89 01/23/2016 1716   Lipase      Component Value Date/Time   LIPASE 27 08/23/2016 1943       Studies/Results: Dg Ercp Biliary & Pancreatic Ducts  Result Date: 08/25/2016 CLINICAL DATA:  Biliary obstruction and choledocholithiasis. EXAM: ERCP TECHNIQUE: Multiple spot images obtained with the fluoroscopic device and submitted for interpretation post-procedure. COMPARISON:  Abdominal ultrasound on 08/23/2016 FINDINGS: Fluoroscopic images demonstrate cannulation of the common bile duct with contrast injection demonstrating visible filling defects within the distal common bile duct. The common bile duct is diffusely dilated. After sphincterotomy, a balloon sweep maneuver was performed to extract calculi. Completion cholangiogram demonstrates no evidence of persistent filling defects. IMPRESSION: ERCP images demonstrate calculi within the distal common bile duct with associated common duct dilatation. A balloon sweep maneuver was performed to extract calculi and completion cholangiography demonstrates no persistent filling defects. These images were submitted for radiologic interpretation only. Please see the procedural report for the amount of contrast and the fluoroscopy time utilized. Electronically Signed   By: Irish LackGlenn  Yamagata M.D.   On: 08/25/2016 16:15    Anti-infectives: Anti-infectives    Start     Dose/Rate Route Frequency Ordered Stop   08/26/16 1015  Ampicillin-Sulbactam (UNASYN) 3 g in sodium chloride 0.9 % 100 mL IVPB  Status:  Discontinued     3 g 200 mL/hr over 30 Minutes Intravenous  On call to O.R. 08/26/16 1001 08/26/16 1010   08/26/16 1015  Ampicillin-Sulbactam (UNASYN) 3 g in sodium chloride 0.9 % 100 mL IVPB     3 g 200 mL/hr over 30 Minutes Intravenous To Surgery 08/26/16 1010 08/26/16 1050   08/25/16 1000  Ampicillin-Sulbactam (UNASYN) 3 g in sodium chloride 0.9 % 100 mL IVPB     3 g 200 mL/hr over 30 Minutes Intravenous To [facility] 08/25/16 0952 08/25/16 1401        Assessment/Plan Cholelithiasis and choledocholithiasis S/p LAPAROSCOPIC CHOLECYSTECTOMY 08/26/16 Dr. Lindie Spruce - POD 1 - ERCP 6/4 - LFTs trending down  ID - unasyn 6/4>>6/5 FEN - soft diet VTE - SCDs, lovenox  Plan - ready for discharge from our standpoint. Pain rx and work note on chart. She will follow up in our office in about 3 weeks.   LOS: 3 days    Edson Snowball , [facility] Surgery 08/27/2016, 10:13 AM Pager: [phone] Consults: [phone] Mon-Fri 7:00 am-4:30 pm Sat-Sun 7:00 am-11:30 am

## 2016-08-27 NOTE — Discharge Summary (Signed)
Physician Discharge Summary  Jennifer Guzman SHF:[id number] DOB: 1990/12/17 DOA: 08/23/2016  PCP: Patient, No Pcp Per  Admit date: 08/23/2016 Discharge date: 08/27/2016  Admitted From: Home Disposition: Home   Recommendations for Outpatient Follow-up:  1. Follow up with [facility] 6/26 as scheduled for post-op appointment. 2. Consider repeat CMP to trend LFTs and monitor mild hypokalemia.   Home Health: None Equipment/Devices: None Discharge Condition: Stable CODE STATUS: Full Diet recommendation: As tolerated  Brief/Interim Summary: Jennifer Kaffenberger Cobleis a 26 y.o.femalewith a history of symptomatic cholelithiasis who presented to the ED with recurrent RUQ pain. She describes "attacks" of severe pain in the right upper abdomen radiating to the back  intermittently reliably after food intake, worse with fatty foods, worsening over the past week, becoming associated with nausea and vomiting. She has been under the care of general surgery, monitoring her clinically. On arrival she was afebrile in no distress with RUQ tenderness. Abd U/S showed cholelithiasis without cholecystitis, and CBD dilation suggestive of choledocholithiasis. TBili 6.6, AST 86, ALT 242, Alk Phos 128. Normal CBC, negative UPT. GI was consulted performing ERCP 6/4. Laparoscopic cholecystectomy for chronic cholecystitis was performed 6/5. Post-operative course has been uncomplicated.  Discharge Diagnoses:  Active Problems:   Common bile duct stone   Calculus of gallbladder with acute cholecystitis and obstruction  Choledocholithiasis: LFTs trending downward. ERCP 6/4, Lap chole 6/5. Received perioperative unasyn. Tolerating advanced diet.  - Continue oxycodone prn pain per general surgery - Follow up appointment scheduled prior to discharge  Hypokalemia: Acute, mild. Suspected due to GI losses. 3.4 at discharge.  - Treat with diet.  - Consider recheck with CMP as above.   Discharge Instructions Discharge Instructions     Discharge instructions    Complete by:  As directed    Follow up with general surgery as scheduled Take oxycodone only for severe pain, otherwise ibuprofen.  If you notice fever, worsening abdominal pain, inability to eat/vomiting, seek medical attention right away.     Allergies as of 08/27/2016      Reactions   No Known Allergies       Medication List    TAKE these medications   clonazePAM 0.5 MG tablet Commonly known as:  KLONOPIN Take 1 tablet (0.5 mg total) by mouth 2 (two) times daily as needed for anxiety.   ibuprofen 200 MG tablet Commonly known as:  ADVIL,MOTRIN Take 400 mg by mouth every 6 (six) hours as needed (for pain).   oxyCODONE-acetaminophen 5-325 MG tablet Commonly known as:  PERCOCET/ROXICET Take 1-2 tablets by mouth every 6 (six) hours as needed for moderate pain.      Follow-up [facility], [place]. Go on 09/16/2016.   Specialty:  General Surgery Why:  Your appointment is 09/16/16 at 10:30AM. Please arrive 30 minutes prior to your appointment to check in and fill out necessary paperwork. Contact information: [place] [phone]         Allergies  Allergen Reactions  . No Known Allergies     Consultations:  [facility]  Procedures/Studies: US Abdomen Limited  Result Date: 08/23/2016 CLINICAL DATA:  Right upper quadrant pain, chest pain, nausea, vomiting EXAM: US ABDOMEN LIMITED - RIGHT UPPER QUADRANT COMPARISON:  None. FINDINGS: Gallbladder: Multiple gallstones, the largest 9 mm. Summer noted in the region of the gallbladder neck. No wall thickening or sonographic Murphy's. Common bile duct: Diameter: Dilated common bile duct measuring up to  13-14 mm. The distal duct cannot be visualized due to overlying bowel gas. Liver: No focal lesion identified. Within normal limits in parenchymal echogenicity. IMPRESSION: Cholelithiasis. Dilated  common bile duct measuring up to 14 mm. The distal duct cannot be visualized, but the appearance is concerning for possible obstructing distal CBD stone. This could be further evaluated with MRCP or ERCP. Electronically Signed   By: Rolm Baptise M.D.   On: 08/23/2016 23:32   Dg Ercp Biliary & Pancreatic Ducts  Result Date: 08/25/2016 CLINICAL DATA:  Biliary obstruction and choledocholithiasis. EXAM: ERCP TECHNIQUE: Multiple spot images obtained with the fluoroscopic device and submitted for interpretation post-procedure. COMPARISON:  Abdominal ultrasound on 08/23/2016 FINDINGS: Fluoroscopic images demonstrate cannulation of the common bile duct with contrast injection demonstrating visible filling defects within the distal common bile duct. The common bile duct is diffusely dilated. After sphincterotomy, a balloon sweep maneuver was performed to extract calculi. Completion cholangiogram demonstrates no evidence of persistent filling defects. IMPRESSION: ERCP images demonstrate calculi within the distal common bile duct with associated common duct dilatation. A balloon sweep maneuver was performed to extract calculi and completion cholangiography demonstrates no persistent filling defects. These images were submitted for radiologic interpretation only. Please see the procedural report for the amount of contrast and the fluoroscopy time utilized. Electronically Signed   By: Aletta Edouard M.D.   On: 08/25/2016 16:15   Subjective: Feels well, pain controlled. Tolerating breakfast "just like before gallbladder issues."   Discharge Exam: Vitals:   08/26/16 2141 08/27/16 0447  BP: 121/69 129/70  Pulse: 79 95  Resp: 18 18  Temp: 98.2 F (36.8 C) 98.3 F (36.8 C)   General: Obese, no distress. Respiratory: Nonlabored on room air Abdominal: Soft, appropriate mild tenderness without rebound or guarding, ND, bowel sounds +, laparoscopic sites c/d/i  Labs: Basic Metabolic Panel:  Recent Labs Lab  08/23/16 1943 08/24/16 0408 08/25/16 0523 08/26/16 0518 08/27/16 0448  NA 136 136 137 138 136  K 3.5 3.3* 3.3* 3.6 3.4*  CL 99* 101 106 106 103  CO2 23 20* '22 23 22  ' GLUCOSE 80 64* 80 79 87  BUN <5* <5* <5* <5* <5*  CREATININE 0.80 0.79 0.70 0.69 0.68  CALCIUM 9.4 8.9 8.8* 8.6* 8.6*  MG  --   --   --  1.5*  --    Liver Function Tests:  Recent Labs Lab 08/23/16 1943 08/24/16 0408 08/25/16 0523 08/27/16 0448  AST 86* 71* 64* 75*  ALT 242* 199* 165* 157*  ALKPHOS 128* 108 104 78  BILITOT 6.6* 4.9* 2.9* 1.6*  PROT 8.0 6.6 6.1* 5.7*  ALBUMIN 3.9 3.2* 3.3* 3.1*   CBC:  Recent Labs Lab 08/23/16 1943 08/24/16 0408 08/25/16 0523 08/27/16 0448  WBC 7.5 5.4 4.3 8.2  HGB 13.9 12.3 12.5 11.8*  HCT 40.3 36.1 37.1 35.5*  MCV 85.6 86.6 87.3 88.1  PLT 270 232 188 181   Time coordinating discharge: Approximately 40 minutes  Vance Gather, MD  [facility] 08/27/2016, 11:44 AM Pager [phone]  If 7PM-7AM, please contact night-coverage [email or web address] Password [email or web address]

## 2017-10-28 IMAGING — RF DG ERCP WO/W SPHINCTEROTOMY
1 series · 8 of 8 positions shown · non-contrast
Comparison: Abdominal ultrasound on 08/23/2016

CLINICAL DATA: Biliary obstruction and choledocholithiasis.

EXAM:
ERCP
TECHNIQUE: Multiple spot images obtained with the fluoroscopic device and
submitted for interpretation post-procedure.

[Series 1: run · 8 of 8 slices shown]
[im 1/8]
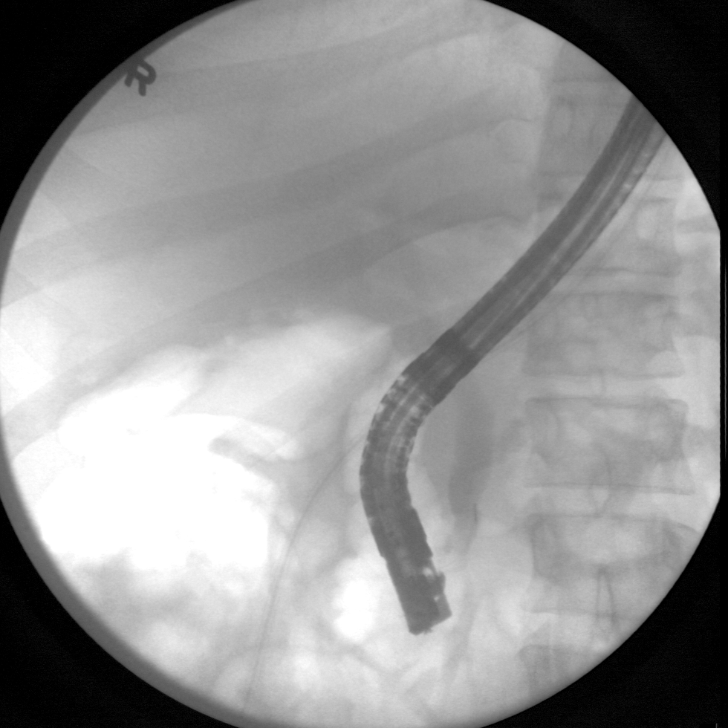
[im 2/8]
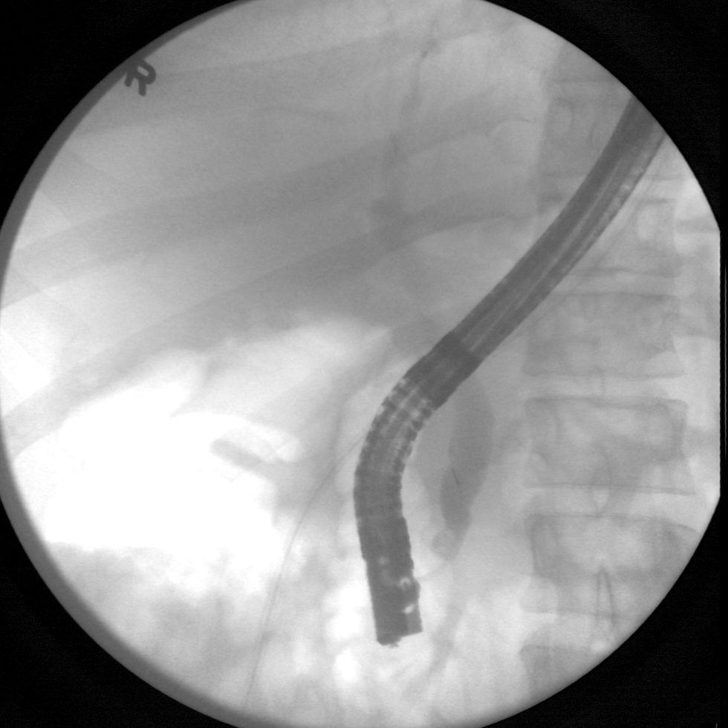
[im 3/8]
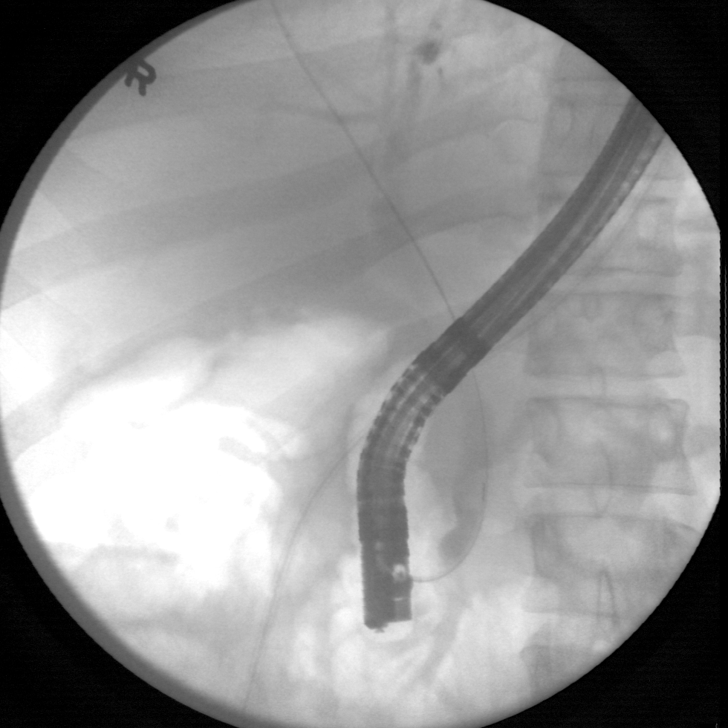
[im 4/8]
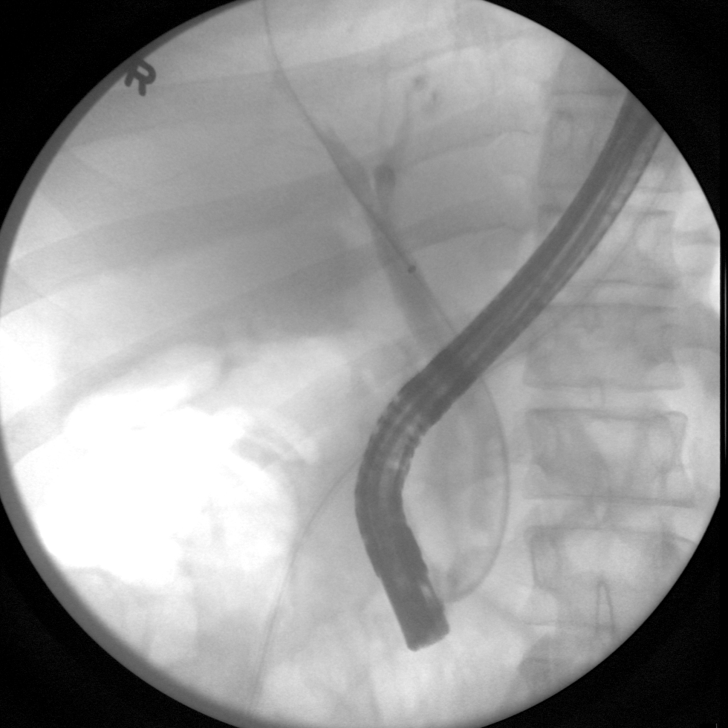
[im 5/8]
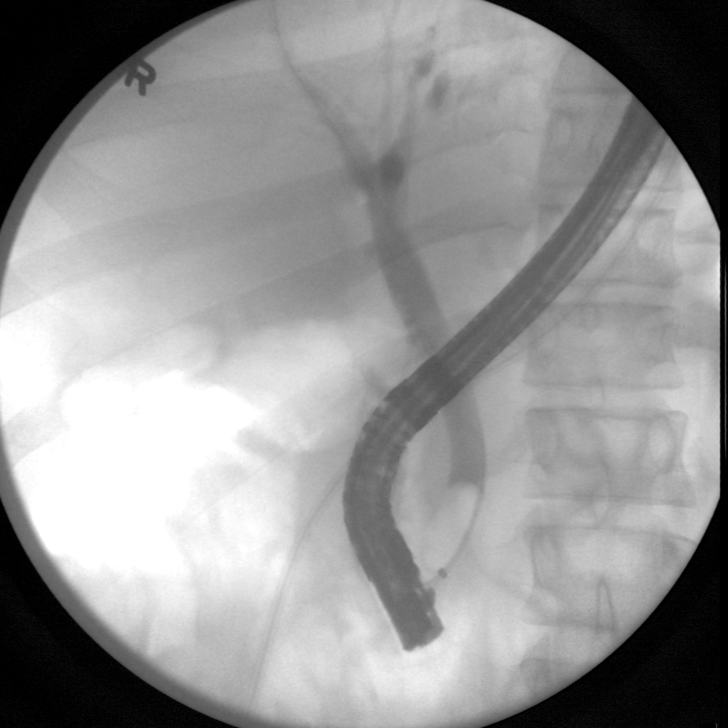
[im 6/8]
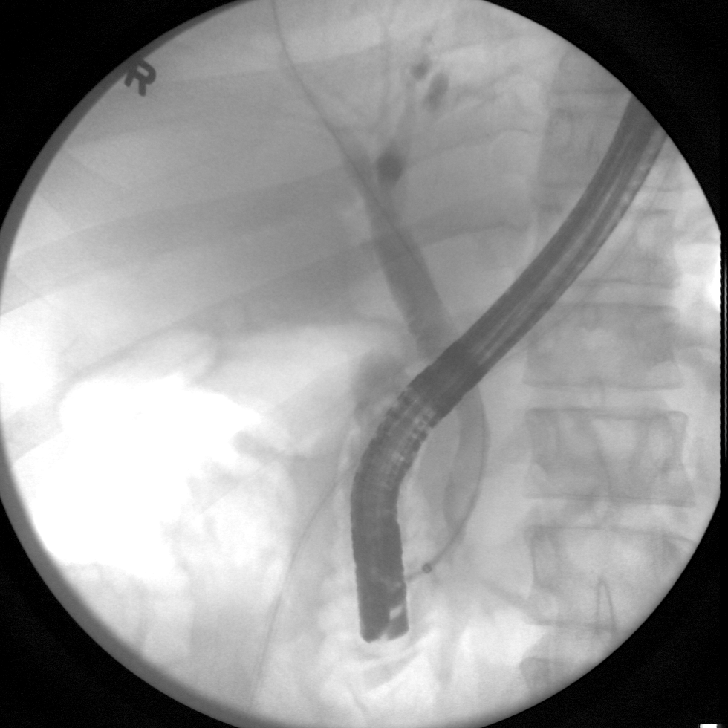
[im 7/8]
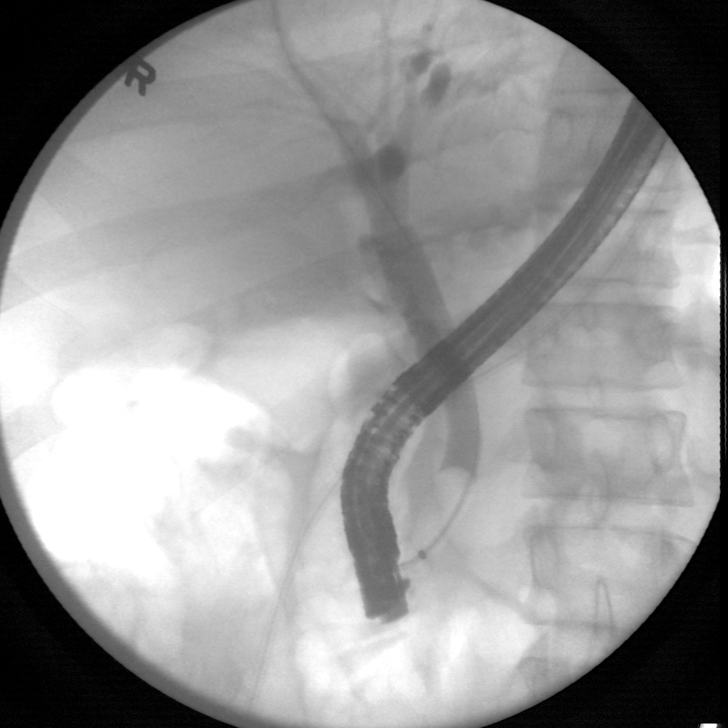
[im 8/8]
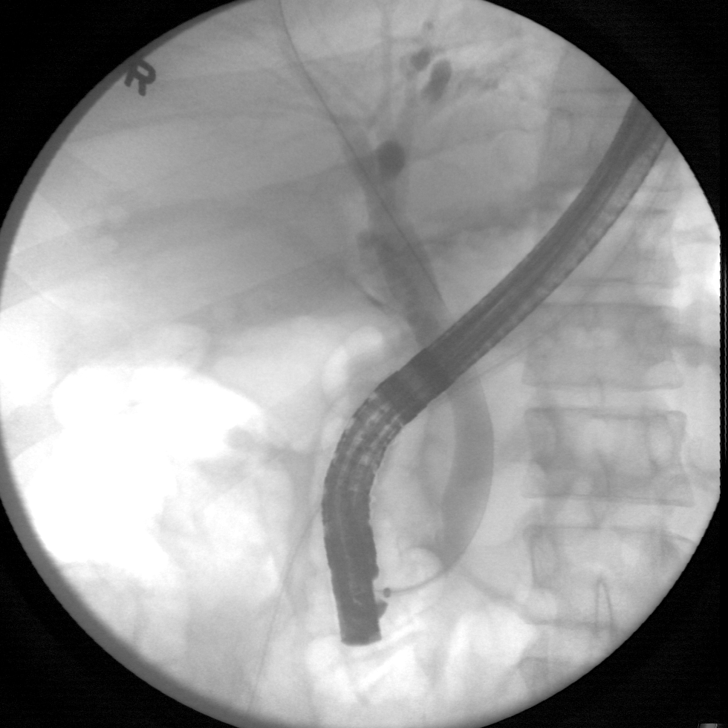

[8 of 8 positions shown; findings below may reference images not displayed]

FINDINGS: Fluoroscopic images demonstrate cannulation of the common bile duct
with contrast injection demonstrating visible filling defects within
the distal common bile duct. The common bile duct is diffusely
dilated. After sphincterotomy, a balloon sweep maneuver was
performed to extract calculi. Completion cholangiogram demonstrates
no evidence of persistent filling defects.
IMPRESSION: ERCP images demonstrate calculi within the distal common bile duct
with associated common duct dilatation. A balloon sweep maneuver was
performed to extract calculi and completion cholangiography
demonstrates no persistent filling defects.

These images were submitted for radiologic interpretation only.
Please see the procedural report for the amount of contrast and the
fluoroscopy time utilized.

## 2019-05-11 IMAGING — US US ABDOMEN LIMITED
1 series · 14 of 25 positions shown · non-contrast
Comparison: None.

CLINICAL DATA: Right upper quadrant pain, chest pain, nausea,
vomiting

EXAM:
US ABDOMEN LIMITED - RIGHT UPPER QUADRANT

[Series 1: us abdomen limited · 0.23mm/px · 14 of 57 slices shown]
[im 1/57]
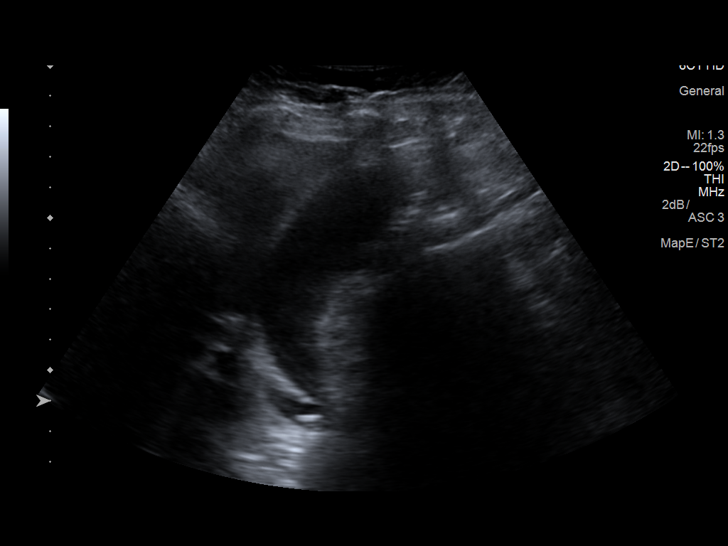
[im 5/57]
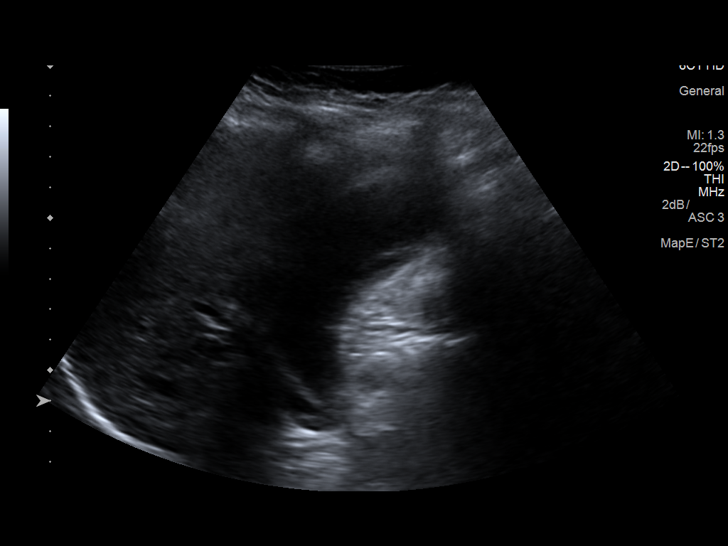
[im 10/57]
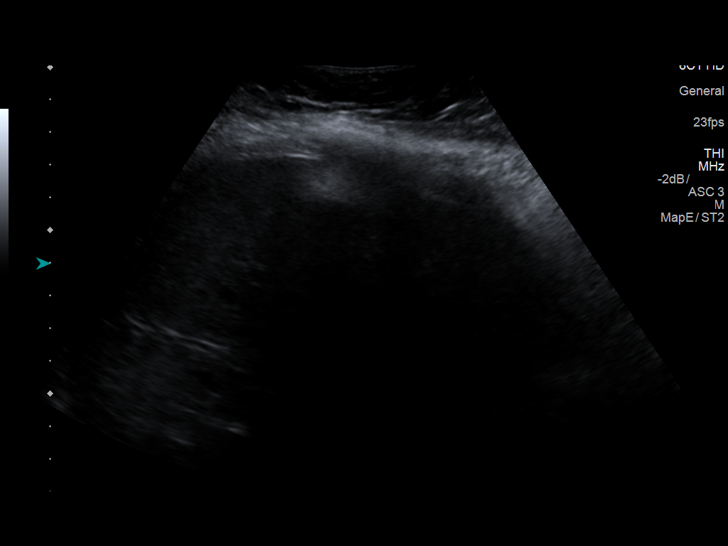
[im 15/57]
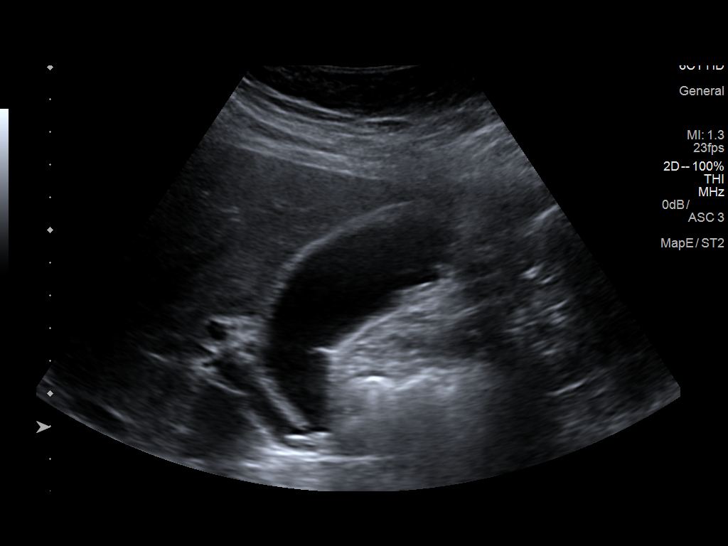
[im 19/57]
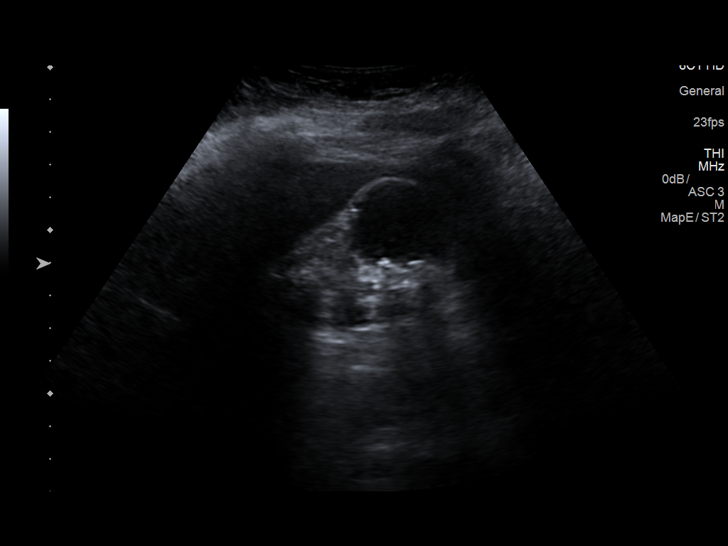
[im 22/57]
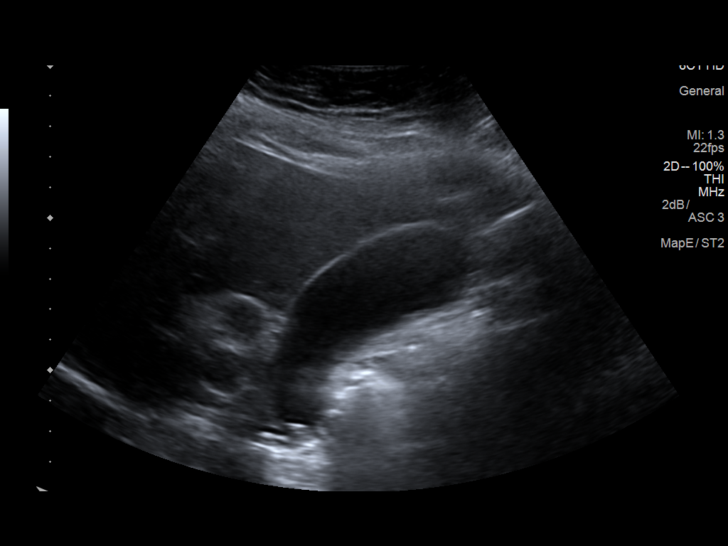
[im 26/57]
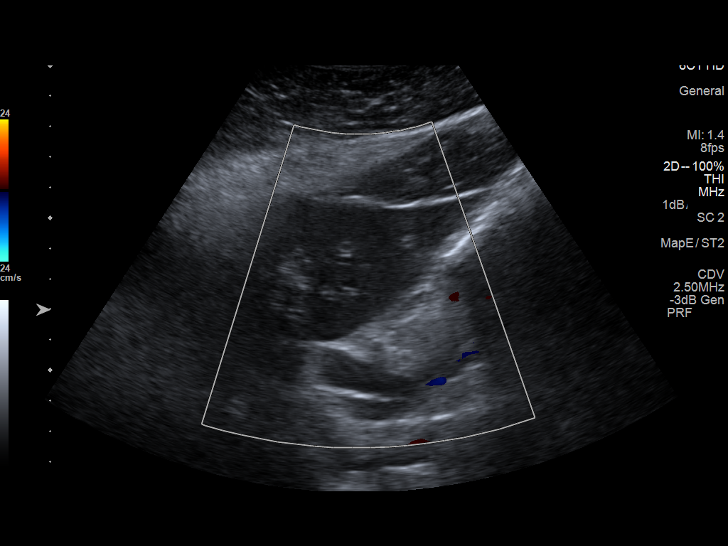
[im 31/57]
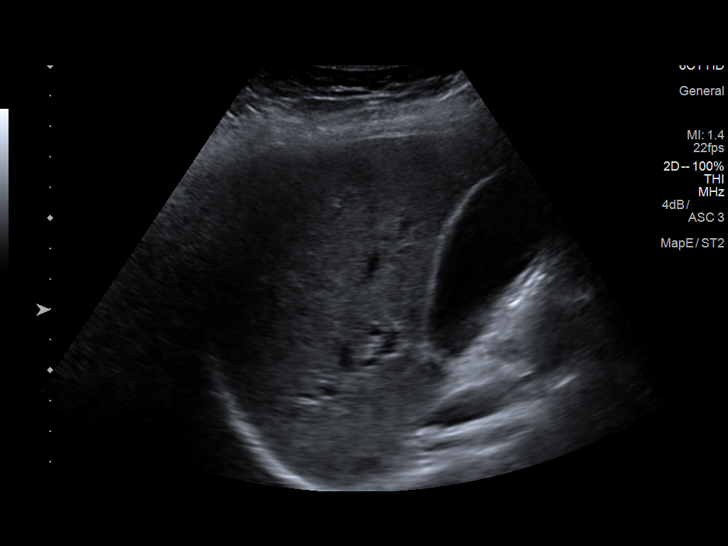
[im 36/57]
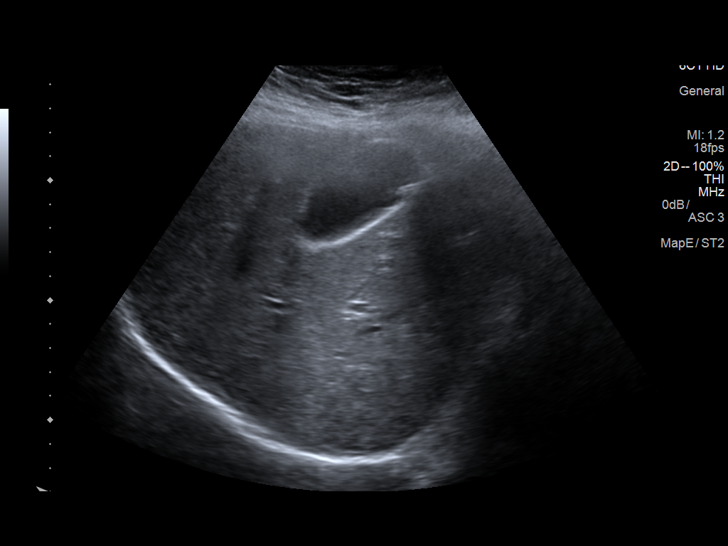
[im 38/57]
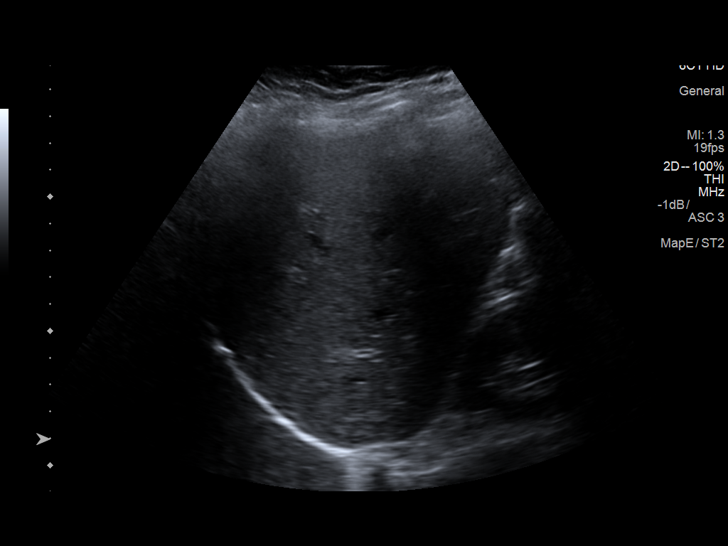
[im 43/57]
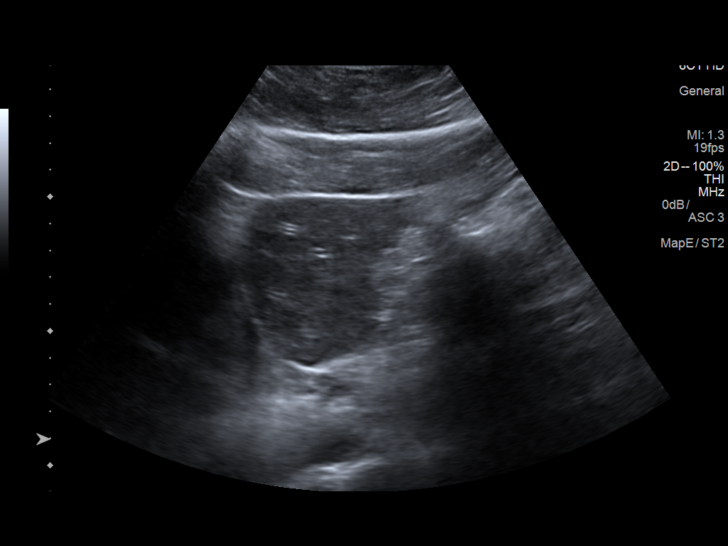
[im 47/57]
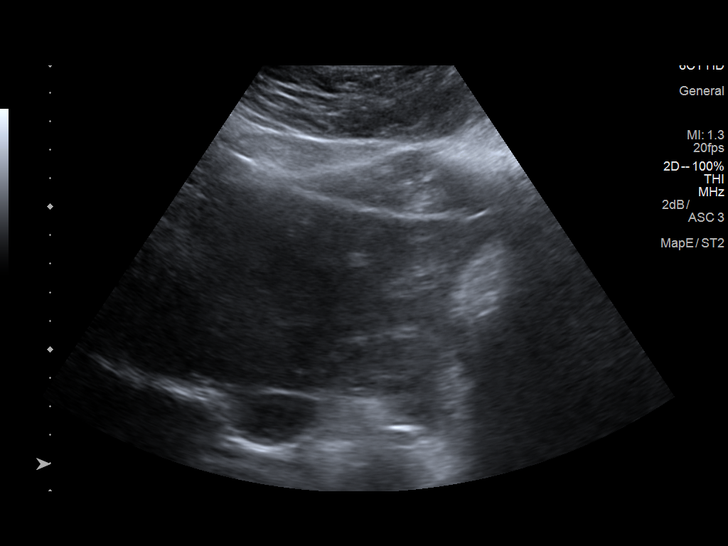
[im 52/57]
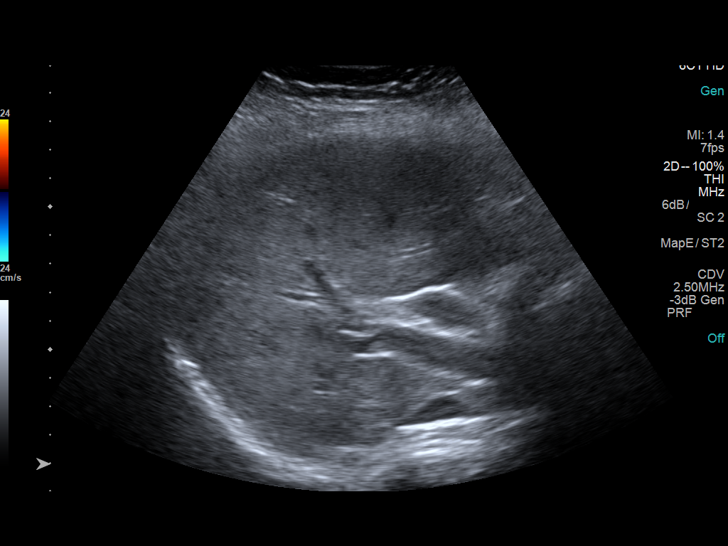
[im 57/57]
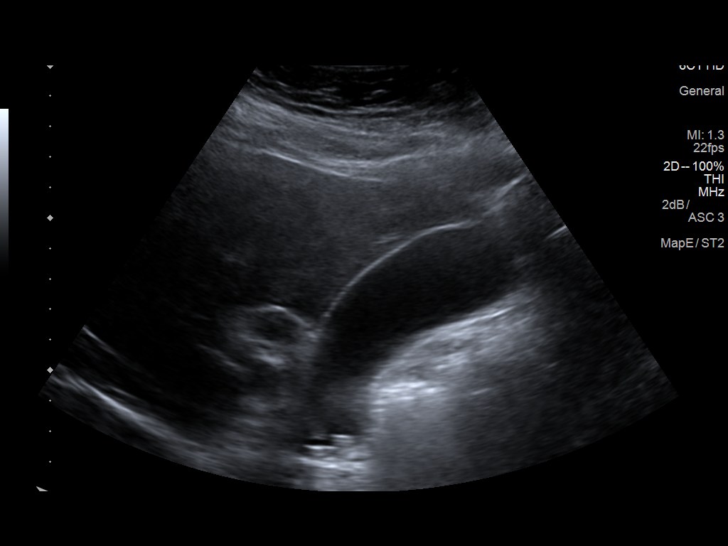

[14 of 25 positions shown; findings below may reference images not displayed]

FINDINGS: Gallbladder:

Multiple gallstones, the largest 9 mm. [REDACTED] noted in the region of
the gallbladder neck. No wall thickening or sonographic Unlu.

Common bile duct:

Diameter: Dilated common bile duct measuring up to 13-14 mm. The
distal duct cannot be visualized due to overlying bowel gas.

Liver:

No focal lesion identified. Within normal limits in parenchymal
echogenicity.
IMPRESSION: Cholelithiasis.

Dilated common bile duct measuring up to 14 mm. The distal duct
cannot be visualized, but the appearance is concerning for possible
obstructing distal CBD stone. This could be further evaluated with
MRCP or ERCP.

## 2019-06-04 ENCOUNTER — Ambulatory Visit: Payer: Medicaid Other | Attending: Internal Medicine

## 2019-06-04 DIAGNOSIS — Z23 Encounter for immunization: Secondary | ICD-10-CM

## 2019-06-04 NOTE — Progress Notes (Signed)
   Covid-19 Vaccination Clinic  Name:  TIFFNEY HAUGHTON    MRN: [id number] DOB: 09/24/1990  06/04/2019  Ms. Diantonio was observed post Covid-19 immunization for 15 minutes without incident. She was provided with Vaccine Information Sheet and instruction to access the V-Safe system.   Ms. Hokenson was instructed to call 911 with any severe reactions post vaccine: Marland Kitchen Difficulty breathing  . Swelling of face and throat  . A fast heartbeat  . A bad rash all over body  . Dizziness and weakness   Immunizations Administered    Name Date Dose VIS Date Route   Pfizer COVID-19 Vaccine 06/04/2019  1:24 PM 0.3 mL 03/04/2019 Intramuscular   Manufacturer: [employer], [employer]   Lot: [id number]   NDC: [address]-9

## 2019-06-28 ENCOUNTER — Ambulatory Visit: Payer: Medicaid Other | Attending: Internal Medicine

## 2019-06-28 DIAGNOSIS — Z23 Encounter for immunization: Secondary | ICD-10-CM

## 2019-06-28 NOTE — Progress Notes (Signed)
   Covid-19 Vaccination Clinic  Name:  Jennifer Guzman    MRN: [id number] DOB: 1990-06-15  06/28/2019  [name] was observed post Covid-19 immunization for 15 minutes without incident. She was provided with Vaccine Information Sheet and instruction to access the V-Safe system.   [name] was instructed to call 911 with any severe reactions post vaccine: Marland Kitchen Difficulty breathing  . Swelling of face and throat  . A fast heartbeat  . A bad rash all over body  . Dizziness and weakness   Immunizations Administered    Name Date Dose VIS Date Route   Pfizer COVID-19 Vaccine 06/28/2019  1:10 PM 0.3 mL 03/04/2019 Intramuscular   Manufacturer: [employer], [employer]   Lot: [id number]   NDC: [address]-3

## 2019-07-14 ENCOUNTER — Ambulatory Visit (INDEPENDENT_AMBULATORY_CARE_PROVIDER_SITE_OTHER): Payer: Self-pay | Admitting: Nurse Practitioner

## 2019-07-14 ENCOUNTER — Other Ambulatory Visit: Payer: Self-pay

## 2019-07-14 ENCOUNTER — Encounter: Payer: Self-pay | Admitting: Nurse Practitioner

## 2019-07-14 VITALS — BP 130/80 | HR 92 | Temp 98.0°F | Ht 64.5 in | Wt 320.8 lb

## 2019-07-14 DIAGNOSIS — M25512 Pain in left shoulder: Secondary | ICD-10-CM

## 2019-07-14 DIAGNOSIS — G8929 Other chronic pain: Secondary | ICD-10-CM | POA: Insufficient documentation

## 2019-07-14 DIAGNOSIS — Z6841 Body Mass Index (BMI) 40.0 and over, adult: Secondary | ICD-10-CM

## 2019-07-14 MED ORDER — NAPROXEN 500 MG PO TABS: 500.0000 mg | ORAL_TABLET | Freq: Two times a day (BID) | ORAL | 0 refills | Status: DC

## 2019-07-14 NOTE — Assessment & Plan Note (Signed)
She brought up her weight and we discussed briefly and she was encouraged to follow up.   She has had no preventative health care. Applying for a job with benefits and if she gets that, will return for care. If not, I gave her Phineas Real information.
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
## 2019-07-14 NOTE — Assessment & Plan Note (Signed)
Onset 2018 and uninvestigated. Features of impingement and rotator cuff injury. Declines Xray and favors seeing Ortho and hoping no costly imaging will need to be done. Explained the role of imaging with plain Xray and she will likely need additional studies as well. Naproxen as needed and shoulder care sheet in AVS.

## 2019-07-14 NOTE — Patient Instructions (Addendum)
Nice to meet you today.  You may want to look into the [facility] for routine health care.  That is in [place], [place].  I have placed a referral to orthopedics.  I would recommend shoulder Xrays. I suspect a shoulder impingement and likely rotator cuff injury. Please call us back if symptoms worsen. If you have not had a referral call by next week- call us back.    Please try Naproxen 500 mg twice a day for 7-10 days as needed for pain.  Tylenol as directed on the bottle. Use ice or heat- whichever makes it feel better. You prefer heat. Resting your shoulder and avoiding all activities that cause pain or put stress on the shoulder as possible.     Shoulder Impingement Syndrome  Shoulder impingement syndrome is a condition that causes pain when connective tissues (tendons) surrounding the shoulder joint become pinched. These tendons are part of the group of muscles and tissues that help to stabilize the shoulder (rotator cuff). Beneath the rotator cuff is a fluid-filled sac (bursa) that allows the muscles and tendons to glide smoothly. The bursa may become swollen or irritated (bursitis). Bursitis, swelling in the rotator cuff tendons, or both conditions can decrease how much space is under a bone in the shoulder joint (acromion), resulting in impingement. What are the causes? Shoulder impingement syndrome may be caused by bursitis or swelling of the rotator cuff tendons, which may result from:  Repetitive overhead arm movements.  Falling onto the shoulder.  Weakness in the shoulder muscles. What increases the risk? You may be more likely to develop this condition if you:  Play sports that involve throwing, such as baseball.  Participate in sports such as tennis, volleyball, and swimming.  Work as a Education administrator, Music therapist, or Pharmacologist. Some people are also more likely to develop impingement syndrome because of the shape of their acromion bone. What are the  signs or symptoms? The main symptom of this condition is pain on the front or side of the shoulder. The pain may:  Get worse when lifting or raising the arm.  Get worse at night.  Wake you up from sleeping.  Feel sharp when the shoulder is moved and then fade to an ache. Other symptoms may include:  Tenderness.  Stiffness.  Inability to raise the arm above shoulder level or behind the body.  Weakness. How is this diagnosed? This condition may be diagnosed based on:  Your symptoms and medical history.  A physical exam.  Imaging tests, such as: ? X-rays. ? MRI. ? Ultrasound. How is this treated? This condition may be treated by:  Resting your shoulder and avoiding all activities that cause pain or put stress on the shoulder.  Icing your shoulder.  NSAIDs to help reduce pain and swelling.  One or more injections of medicines to numb the area and reduce inflammation.  Physical therapy.  Surgery. This may be needed if nonsurgical treatments have not helped. Surgery may involve repairing the rotator cuff, reshaping the acromion, or removing the bursa. Follow these instructions at home: Managing pain, stiffness, and swelling   If directed, put ice on the injured area. ? Put ice in a plastic bag. ? Place a towel between your skin and the bag. ? Leave the ice on for 20 minutes, 2-3 times a day. Activity  Rest and return to your normal activities as told by your health care provider. Ask your health care provider what activities are safe for you.  Do exercises as told by your health care provider. General instructions  Do not use any products that contain nicotine or tobacco, such as cigarettes, e-cigarettes, and chewing tobacco. These can delay healing. If you need help quitting, ask your health care provider.  Ask your health care provider when it is safe for you to drive.  Take over-the-counter and prescription medicines only as told by your health care  provider.  Keep all follow-up visits as told by your health care provider. This is important. How is this prevented?  Give your body time to rest between periods of activity.  Be safe and responsible while being active. This will help you avoid falls.  Maintain physical fitness, including strength and flexibility. Contact a health care provider if:  Your symptoms have not improved after 1-2 months of treatment and rest.  You cannot lift your arm away from your body. Summary  Shoulder impingement syndrome is a condition that causes pain when connective tissues (tendons) surrounding the shoulder joint become pinched.  The main symptom of this condition is pain on the front or side of the shoulder.  This condition is usually treated with rest, ice, and pain medicines as needed. This information is not intended to replace advice given to you by your health care provider. Make sure you discuss any questions you have with your health care provider. Document Revised: 07/02/2018 Document Reviewed: 09/02/2017 Elsevier Patient Education  2020 [employer].

## 2021-02-18 ENCOUNTER — Encounter: Payer: Self-pay | Admitting: Emergency Medicine

## 2021-02-18 ENCOUNTER — Other Ambulatory Visit: Payer: Self-pay

## 2021-02-18 ENCOUNTER — Ambulatory Visit
Admission: EM | Admit: 2021-02-18 | Discharge: 2021-02-18 | Disposition: A | Discharge status: Home / Self Care | Payer: Medicaid Other | Attending: Internal Medicine | Admitting: Internal Medicine

## 2021-02-18 DIAGNOSIS — J029 Acute pharyngitis, unspecified: Secondary | ICD-10-CM | POA: Insufficient documentation

## 2021-02-18 DIAGNOSIS — J069 Acute upper respiratory infection, unspecified: Secondary | ICD-10-CM | POA: Insufficient documentation

## 2021-02-18 LAB — POCT RAPID STREP A (OFFICE): Rapid Strep A Screen: NEGATIVE

## 2021-02-18 NOTE — Discharge Instructions (Signed)
You likely having a viral upper respiratory infection. We recommended symptom control. I expect your symptoms to start improving in the next 1-2 weeks.   1. Take a daily allergy pill/anti-histamine like Zyrtec, Claritin, or Store brand consistently for 2 weeks  2. For congestion you may try an oral decongestant like Mucinex or sudafed. You may also try intranasal flonase nasal spray or saline irrigations (neti pot, sinus cleanse)  3. For your sore throat you may try cepacol lozenges, salt water gargles, throat spray. Treatment of congestion may also help your sore throat.  4. For cough you may try Robitussen, Mucinex DM  5. Take Tylenol or Ibuprofen to help with pain/inflammation  6. Stay hydrated, drink plenty of fluids to keep throat coated and less irritated  Honey Tea For cough/sore throat try using a honey-based tea. Use 3 teaspoons of honey with juice squeezed from half lemon. Place shaved pieces of ginger into 1/2-1 cup of water and warm over stove top. Then mix the ingredients and repeat every 4 hours as needed.   Rapid strep test was negative. throat culture is pending.

## 2021-02-18 NOTE — ED Triage Notes (Signed)
Yellow nasal mucus, drainage, and congestion with a sore throat starting yesterday. Needs a return to work note.

## 2021-02-18 NOTE — ED Provider Notes (Signed)
[facility]    CSN: [id number] Arrival date & time: 02/18/21  1108      History   Chief Complaint Chief Complaint  Patient presents with   Nasal Congestion    HPI Jennifer Guzman is a 30 y.o. female.   Patient presents with nasal drainage, nasal congestion, sore throat that started yesterday.  Denies cough, fever, ear pain, chest pain, shortness of breath, nausea, vomiting, diarrhea.  Denies any known sick contacts.  Has taken NyQuil with minimal improvement in symptoms.    History reviewed. No pertinent past medical history.  Patient Active Problem List   Diagnosis Date Noted   Chronic pain in left shoulder 07/14/2019   Class 3 severe obesity with body mass index (BMI) of 50.0 to 59.9 in adult [facility]) 07/14/2019   Calculus of gallbladder with acute cholecystitis and obstruction    Common bile duct stone 08/24/2016    Past Surgical History:  Procedure Laterality Date   CHOLECYSTECTOMY N/A 08/26/2016   Procedure: LAPAROSCOPIC CHOLECYSTECTOMY;  Surgeon: Jimmye Norman, MD;  Location: [facility] OR;  Service: General;  Laterality: N/A;   ERCP N/A 08/25/2016   Procedure: ENDOSCOPIC RETROGRADE CHOLANGIOPANCREATOGRAPHY (ERCP);  Surgeon: Hilarie Fredrickson, MD;  Location: [facility] ENDOSCOPY;  Service: Endoscopy;  Laterality: N/A;    OB History   No obstetric history on file.      Home Medications    Prior to Admission medications   Medication Sig Start Date End Date Taking? Authorizing Provider  ibuprofen (ADVIL,MOTRIN) 200 MG tablet Take 400 mg by mouth every 6 (six) hours as needed (for pain).    [provider]  naproxen (NAPROSYN) 500 MG tablet Take 1 tablet (500 mg total) by mouth 2 (two) times daily with a meal. 07/14/19   Theadore Nan, NP    Family History Family History  Problem Relation Age of Onset   Heart disease Mother    Diabetes Maternal Grandmother     Social History Social History   Tobacco Use   Smoking status: Never   Smokeless tobacco:  Never  Substance Use Topics   Alcohol use: Not Currently    Comment: Rare -socially   Drug use: No     Allergies   No known allergies   Review of Systems Review of Systems Per HPI  Physical Exam Triage Vital Signs ED Triage Vitals  Enc Vitals Group     BP 02/18/21 1417 139/87     Pulse Rate 02/18/21 1417 (!) 117     Resp 02/18/21 1417 16     Temp 02/18/21 1417 98 F (36.7 C)     Temp Source 02/18/21 1417 Oral     SpO2 02/18/21 1417 98 %     Weight --      Height --      Head Circumference --      Peak Flow --      Pain Score 02/18/21 1421 6     Pain Loc --      Pain Edu? --      Excl. in GC? --    No data found.  Updated Vital Signs BP 139/87 (BP Location: Left Arm)   Pulse (!) 117   Temp 98 F (36.7 C) (Oral)   Resp 16   SpO2 98%   Visual Acuity Right Eye Distance:   Left Eye Distance:   Bilateral Distance:    Right Eye Near:   Left Eye Near:    Bilateral Near:  Physical Exam Constitutional:      General: She is not in acute distress.    Appearance: Normal appearance. She is not toxic-appearing or diaphoretic.  HENT:     Head: Normocephalic and atraumatic.     Right Ear: Tympanic membrane and ear canal normal.     Left Ear: Tympanic membrane and ear canal normal.     Nose: Congestion present.     Mouth/Throat:     Mouth: Mucous membranes are moist.     Pharynx: Posterior oropharyngeal erythema present.  Eyes:     Extraocular Movements: Extraocular movements intact.     Conjunctiva/sclera: Conjunctivae normal.     Pupils: Pupils are equal, round, and reactive to light.  Cardiovascular:     Rate and Rhythm: Normal rate and regular rhythm.     Pulses: Normal pulses.     Heart sounds: Normal heart sounds.  Pulmonary:     Effort: Pulmonary effort is normal. No respiratory distress.     Breath sounds: Normal breath sounds. No stridor. No wheezing, rhonchi or rales.  Abdominal:     General: Abdomen is flat. Bowel sounds are normal.      Palpations: Abdomen is soft.  Musculoskeletal:        General: Normal range of motion.     Cervical back: Normal range of motion.  Skin:    General: Skin is warm and dry.  Neurological:     General: No focal deficit present.     Mental Status: She is alert and oriented to person, place, and time. Mental status is at baseline.  Psychiatric:        Mood and Affect: Mood normal.        Behavior: Behavior normal.     UC Treatments / Results  Labs (all labs ordered are listed, but only abnormal results are displayed) Labs Reviewed  CULTURE, GROUP A STREP [facility])  POCT RAPID STREP A (OFFICE)    EKG   Radiology No results found.  Procedures Procedures (including critical care time)  Medications Ordered in UC Medications - No data to display  Initial Impression / Assessment and Plan / UC Course  I have reviewed the triage vital signs and the nursing notes.  Pertinent labs & imaging results that were available during my care of the patient were reviewed by me and considered in my medical decision making (see chart for details).     Patient presents with symptoms likely from a viral upper respiratory infection. Differential includes bacterial pneumonia, sinusitis, allergic rhinitis, Covid 19, flu, . Do not suspect underlying cardiopulmonary process. Symptoms seem unlikely related to ACS, CHF or COPD exacerbations, pneumonia, pneumothorax. Patient is nontoxic appearing and not in need of emergent medical intervention.  Rapid strep test was negative.  Throat culture is pending.  Suggested COVID-19 and flu testing but patient declined.  Recommended symptom control with over the counter medications: Daily oral anti-histamine, Oral decongestant or IN corticosteroid, saline irrigations, cepacol lozenges, Robitussin, Delsym, honey tea.  Return if symptoms fail to improve in 1-2 weeks or you develop shortness of breath, chest pain, severe headache. Patient states understanding and is  agreeable.  Discharged with PCP followup.  Final Clinical Impressions(s) / UC Diagnoses   Final diagnoses:  Viral upper respiratory infection  Sore throat     Discharge Instructions      You likely having a viral upper respiratory infection. We recommended symptom control. I expect your symptoms to start improving in the next 1-2 weeks.  1. Take a daily allergy pill/anti-histamine like Zyrtec, Claritin, or Store brand consistently for 2 weeks  2. For congestion you may try an oral decongestant like Mucinex or sudafed. You may also try intranasal flonase nasal spray or saline irrigations (neti pot, sinus cleanse)  3. For your sore throat you may try cepacol lozenges, salt water gargles, throat spray. Treatment of congestion may also help your sore throat.  4. For cough you may try Robitussen, Mucinex DM  5. Take Tylenol or Ibuprofen to help with pain/inflammation  6. Stay hydrated, drink plenty of fluids to keep throat coated and less irritated  Honey Tea For cough/sore throat try using a honey-based tea. Use 3 teaspoons of honey with juice squeezed from half lemon. Place shaved pieces of ginger into 1/2-1 cup of water and warm over stove top. Then mix the ingredients and repeat every 4 hours as needed.   Rapid strep test was negative. throat culture is pending.    ED Prescriptions   None    PDMP not reviewed this encounter.   Gustavus Bryant, [place] 02/18/21 1515

## 2021-02-21 LAB — CULTURE, GROUP A STREP (THRC)

## 2023-09-23 ENCOUNTER — Encounter: Payer: Self-pay | Admitting: Nurse Practitioner

## 2023-09-23 ENCOUNTER — Ambulatory Visit: Payer: Self-pay | Admitting: Nurse Practitioner

## 2023-09-23 VITALS — BP 124/78 | HR 110 | Temp 97.8°F | Resp 18 | Ht 65.0 in | Wt 322.1 lb

## 2023-09-23 DIAGNOSIS — Z130 Encounter for screening for diseases of the blood and blood-forming organs and certain disorders involving the immune mechanism: Secondary | ICD-10-CM

## 2023-09-23 DIAGNOSIS — Z131 Encounter for screening for diabetes mellitus: Secondary | ICD-10-CM | POA: Diagnosis not present

## 2023-09-23 DIAGNOSIS — Z6841 Body Mass Index (BMI) 40.0 and over, adult: Secondary | ICD-10-CM

## 2023-09-23 DIAGNOSIS — R350 Frequency of micturition: Secondary | ICD-10-CM

## 2023-09-23 DIAGNOSIS — Z13220 Encounter for screening for lipoid disorders: Secondary | ICD-10-CM

## 2023-09-23 DIAGNOSIS — R233 Spontaneous ecchymoses: Secondary | ICD-10-CM

## 2023-09-23 DIAGNOSIS — F909 Attention-deficit hyperactivity disorder, unspecified type: Secondary | ICD-10-CM | POA: Diagnosis not present

## 2023-09-23 DIAGNOSIS — G5601 Carpal tunnel syndrome, right upper limb: Secondary | ICD-10-CM

## 2023-09-23 DIAGNOSIS — R41840 Attention and concentration deficit: Secondary | ICD-10-CM

## 2023-09-23 DIAGNOSIS — E66813 Obesity, class 3: Secondary | ICD-10-CM

## 2023-09-23 LAB — POCT URINALYSIS DIPSTICK
Bilirubin, UA: NEGATIVE
Blood, UA: NEGATIVE
Glucose, UA: NEGATIVE
Ketones, UA: NEGATIVE
Leukocytes, UA: NEGATIVE
Nitrite, UA: NEGATIVE
Odor: NORMAL
Protein, UA: NEGATIVE
Spec Grav, UA: 1.015
Urobilinogen, UA: 0.2 U/dL
pH, UA: 5.5

## 2023-09-23 NOTE — Progress Notes (Signed)
 BP 124/78   Pulse (!) 110   Temp 97.8 F (36.6 C)   Resp 18   Ht 5' 5 (1.651 m)   Wt (!) 322 lb 1.6 oz (146.1 kg)   LMP 09/18/2023   SpO2 97%   BMI 53.60 kg/m    Subjective:    Patient ID: Jennifer Guzman, female    DOB: 14-May-1990, 33 y.o.   MRN: [id number]  HPI: Jennifer Guzman is a 33 y.o. female  Chief Complaint  Patient presents with   Establish Care   Referral    Testing ADHD and to discuss anxiety/ depression   Foot Pain    Left, onset months, spots on top of foot   Hand Pain    Right, strain when drawing    Discussed the use of AI scribe software for clinical note transcription with the patient, who gave verbal consent to proceed.  History of Present Illness Jennifer Guzman is a 33 year old female who presents to establish care.  She has a history of obesity with a current weight of 322 pounds and is interested in consulting a dietitian to address her weight concerns.  She has noticed red spots on her feet and possibly up her legs for about six months. These spots do not itch but occasionally result in superficial bruising. She stands on her feet for about seven and a half hours a day, which causes foot pain that has improved with new shoes.  She experiences pain in her hand that starts when gaming or writing, beginning in the palm and spreading to the back of the hand. There is no numbness associated with this pain.  She reports frequent urination during the day, which has been a lifelong issue but has become more noticeable at work. There is no incontinence or pain during urination.  She has a history of anxiety and depression, which she attributes to work stress and difficulty relaxing. This has been a lifelong issue, worsening with age, and she has not been on medication for it.  She was diagnosed with ADHD as a child but was taken off medication by her parents. She is interested in being retested as an adult to confirm the diagnosis.       09/23/2023    10:17 AM 07/14/2019   10:19 AM 01/23/2016    4:22 PM  Depression screen PHQ 2/9  Decreased Interest 1 1 0  Down, Depressed, Hopeless 1 1 0  PHQ - 2 Score 2 2 0  Altered sleeping 2 2   Tired, decreased energy 3 1   Change in appetite 3 2   Feeling bad or failure about yourself  1 1   Trouble concentrating 2 1   Moving slowly or fidgety/restless 0 0   Suicidal thoughts 0 0   PHQ-9 Score 13 9   Difficult doing work/chores Somewhat difficult Somewhat difficult        09/23/2023   10:19 AM 07/14/2019   10:19 AM  GAD 7 : Generalized Anxiety Score  Nervous, Anxious, on Edge 2 1  Control/stop worrying 2 3  Worry too much - different things 3 1  Trouble relaxing 3 1  Restless 2 1  Easily annoyed or irritable 2 1  Afraid - awful might happen 1 1  Total GAD 7 Score 15 9  Anxiety Difficulty Not difficult at all Somewhat difficult     Relevant past medical, surgical, family and social history reviewed and updated as indicated. Interim medical  history since our last visit reviewed. Allergies and medications reviewed and updated.  Review of Systems  Ten systems reviewed and is negative except as mentioned in HPI      Objective:     BP 124/78   Pulse (!) 110   Temp 97.8 F (36.6 C)   Resp 18   Ht 5' 5 (1.651 m)   Wt (!) 322 lb 1.6 oz (146.1 kg)   LMP 09/18/2023   SpO2 97%   BMI 53.60 kg/m    Wt Readings from Last 3 Encounters:  09/23/23 (!) 322 lb 1.6 oz (146.1 kg)  07/14/19 (!) 320 lb 12.8 oz (145.5 kg)  08/24/16 268 lb 9.6 oz (121.8 kg)    Physical Exam Physical Exam MEASUREMENTS: Weight- 322. GENERAL: Alert, cooperative, well developed, no acute distress. HEENT: Normocephalic, normal oropharynx, moist mucous membranes. CHEST: Clear to auscultation bilaterally, no wheezes, rhonchi, or crackles. CARDIOVASCULAR: Normal heart rate and rhythm, S1 and S2 normal without murmurs. ABDOMEN: Soft, non-tender, non-distended, without organomegaly, normal bowel  sounds. EXTREMITIES: No cyanosis or edema. NEUROLOGICAL: Cranial nerves grossly intact, moves all extremities without gross motor or sensory deficit.   Results for orders placed or performed in visit on 09/23/23  POCT urinalysis dipstick   Collection Time: 09/23/23 11:27 AM  Result Value Ref Range   Color, UA yellow    Clarity, UA clear    Glucose, UA Negative Negative   Bilirubin, UA neg    Ketones, UA neg    Spec Grav, UA 1.015 1.010 - 1.025   Blood, UA neg    pH, UA 5.5 5.0 - 8.0   Protein, UA Negative Negative   Urobilinogen, UA 0.2 0.2 or 1.0 E.U./dL   Nitrite, UA neg    Leukocytes, UA Negative Negative   Appearance clear    Odor normal           Assessment & Plan:   Problem List Items Addressed This Visit   None Visit Diagnoses       Abnormal bruising    -  Primary   Relevant Orders   [facility] COMPREHENSIVE PANEL   CBC with Differential/Platelet   TSH   Protime-INR     Screening for cholesterol level       Relevant Orders   Lipid panel     Screening for diabetes mellitus       Relevant Orders   Comprehensive metabolic panel with GFR   Hemoglobin A1c     Screening for deficiency anemia       Relevant Orders   CBC with Differential/Platelet     Carpal tunnel syndrome of right wrist         Frequent urination       Relevant Orders   POCT urinalysis dipstick (Completed)     Inattention       Relevant Orders   Ambulatory referral to Psychology     Attention deficit hyperactivity disorder (ADHD), unspecified ADHD type         Class 3 severe obesity due to excess calories with serious comorbidity and body mass index (BMI) of 50.0 to 59.9 in adult       Relevant Orders   Referral to Nutrition and Diabetes Services        Assessment and Plan Assessment & Plan Obesity Obesity with a current weight of 322 pounds. Discussed the impact of weight on overall health, including mental health. She expressed interest in weight loss and the potential  benefits it could  have on her health conditions. - Refer to a dietitian for weight management counseling  Red spots and superficial bruising of feet and lower legs Red spots and superficial bruising on feet and lower legs present for approximately six months. No associated itching or pain. Possible stasis dermatitis or other circulatory issue considered. She stands for long periods, which may contribute to symptoms. Differential includes clotting disorders. - Order blood work to check for clotting disorders, including von Counsellor - Consider dermatology referral if blood work is inconclusive and symptoms persist  Hand pain, possible carpal tunnel syndrome Intermittent hand pain, possibly carpal tunnel syndrome, exacerbated by activities such as gaming or writing. No numbness reported. Phalen's test performed with mild reproduction of symptoms. Discussed non-surgical management options, including wrist splints to alleviate symptoms. - Recommend wearing wrist splints, especially at night, to alleviate symptoms  Urinary frequency, possible overactive bladder Long-standing urinary frequency, primarily during the day. No associated pain or incontinence. Possible overactive bladder considered. Need to rule out renal issues before initiating treatment. - Order urinalysis to check for underlying causes of urinary frequency  Attention deficit hyperactivity disorder (ADHD) ADHD diagnosed in childhood. She is interested in re-evaluation as an adult to confirm diagnosis and assess current symptoms. - Refer to [facility] for ADHD evaluation  Anxiety and depression Long-standing anxiety, exacerbated by work stress. No prior medication use. She is interested in evaluating ADHD first to see if management impacts anxiety levels. - Delay discussion of anxiety and depression medication until after ADHD evaluation        Follow up plan: Return for CPE w/ pap.

## 2023-09-24 ENCOUNTER — Ambulatory Visit: Payer: Self-pay | Admitting: Nurse Practitioner

## 2023-09-29 LAB — CBC WITH DIFFERENTIAL/PLATELET
Absolute Lymphocytes: 1631 {cells}/uL (ref 850–3900)
Absolute Monocytes: 372 {cells}/uL (ref 200–950)
Basophils Absolute: 43 {cells}/uL (ref 0–200)
Basophils Relative: 0.7 %
Eosinophils Absolute: 149 {cells}/uL (ref 15–500)
Eosinophils Relative: 2.4 %
HCT: 39.1 % (ref 35.0–45.0)
Hemoglobin: 13.1 g/dL (ref 11.7–15.5)
MCH: 29.5 pg (ref 27.0–33.0)
MCHC: 33.5 g/dL (ref 32.0–36.0)
MCV: 88.1 fL (ref 80.0–100.0)
MPV: 10.3 fL (ref 7.5–12.5)
Monocytes Relative: 6 %
Neutro Abs: 4005 {cells}/uL (ref 1500–7800)
Neutrophils Relative %: 64.6 %
Platelets: 175 Thousand/uL (ref 140–400)
RBC: 4.44 Million/uL (ref 3.80–5.10)
RDW: 12.6 % (ref 11.0–15.0)
Total Lymphocyte: 26.3 %
WBC: 6.2 Thousand/uL (ref 3.8–10.8)

## 2023-09-29 LAB — PROTIME-INR
INR: 0.9
Prothrombin Time: 10 s (ref 9.0–11.5)

## 2023-09-29 LAB — TSH: TSH: 3.7 m[IU]/L

## 2023-09-29 LAB — COMPREHENSIVE METABOLIC PANEL WITH GFR
AG Ratio: 1.4 (calc) (ref 1.0–2.5)
ALT: 13 U/L (ref 6–29)
AST: 13 U/L (ref 10–30)
Albumin: 4.2 g/dL (ref 3.6–5.1)
Alkaline phosphatase (APISO): 62 U/L (ref 31–125)
BUN: 14 mg/dL (ref 7–25)
CO2: 27 mmol/L (ref 20–32)
Calcium: 9.2 mg/dL (ref 8.6–10.2)
Chloride: 102 mmol/L (ref 98–110)
Creat: 0.61 mg/dL (ref 0.50–0.97)
Globulin: 2.9 g/dL (ref 1.9–3.7)
Glucose, Bld: 84 mg/dL (ref 65–99)
Potassium: 4.2 mmol/L (ref 3.5–5.3)
Sodium: 136 mmol/L (ref 135–146)
Total Bilirubin: 0.5 mg/dL (ref 0.2–1.2)
Total Protein: 7.1 g/dL (ref 6.1–8.1)
eGFR: 123 mL/min/1.73m2 (ref >=60)

## 2023-09-29 LAB — LIPID PANEL
Cholesterol: 153 mg/dL (ref <200)
HDL: 53 mg/dL (ref >=50)
LDL Cholesterol (Calc): 79 mg/dL
Non-HDL Cholesterol (Calc): 100 mg/dL (ref <130)
Total CHOL/HDL Ratio: 2.9 (calc) (ref <5.0)
Triglycerides: 109 mg/dL (ref <150)

## 2023-09-29 LAB — VON WILLEBRAND COMPREHENSIVE PANEL
Factor-VIII Activity: 158 %{normal} (ref 50–180)
Ristocetin Co-Factor: 165 %{normal} (ref 42–200)
Von Willebrand Antigen, Plasma: 185 % (ref 50–217)
aPTT: 24 s (ref 23–32)

## 2023-09-29 LAB — HEMOGLOBIN A1C
Hgb A1c MFr Bld: 4.9 % (ref <5.7)
Mean Plasma Glucose: 94 mg/dL
eAG (mmol/L): 5.2 mmol/L

## 2023-11-02 ENCOUNTER — Encounter: Admitting: Nurse Practitioner

## 2023-12-28 ENCOUNTER — Ambulatory Visit: Payer: Self-pay | Admitting: Family Medicine

## 2024-04-13 ENCOUNTER — Ambulatory Visit (INDEPENDENT_AMBULATORY_CARE_PROVIDER_SITE_OTHER): Admitting: Nurse Practitioner

## 2024-04-13 ENCOUNTER — Encounter: Payer: Self-pay | Admitting: Nurse Practitioner

## 2024-04-13 VITALS — BP 130/86 | HR 105 | Temp 98.0°F | Ht 65.0 in | Wt 326.0 lb

## 2024-04-13 DIAGNOSIS — L308 Other specified dermatitis: Secondary | ICD-10-CM

## 2024-04-13 DIAGNOSIS — N6323 Unspecified lump in the left breast, lower outer quadrant: Secondary | ICD-10-CM | POA: Diagnosis not present

## 2024-04-13 MED ORDER — TRIAMCINOLONE ACETONIDE 0.1 % EX OINT: 1.0000 | TOPICAL_OINTMENT | Freq: Two times a day (BID) | CUTANEOUS | 0 refills | Status: AC

## 2024-04-13 NOTE — Patient Instructions (Signed)
 Schedule mammogram [phone]
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
## 2024-04-14 ENCOUNTER — Encounter

## 2024-04-14 ENCOUNTER — Other Ambulatory Visit: Payer: Self-pay | Admitting: Nurse Practitioner

## 2024-04-14 ENCOUNTER — Ambulatory Visit
Admission: RE | Admit: 2024-04-14 | Discharge: 2024-04-14 | Disposition: A | Discharge status: Home / Self Care | Source: Ambulatory Visit | Attending: Nurse Practitioner | Admitting: Nurse Practitioner

## 2024-04-14 DIAGNOSIS — N63 Unspecified lump in unspecified breast: Secondary | ICD-10-CM

## 2024-04-14 DIAGNOSIS — N6323 Unspecified lump in the left breast, lower outer quadrant: Secondary | ICD-10-CM | POA: Insufficient documentation

## 2024-04-14 DIAGNOSIS — L308 Other specified dermatitis: Secondary | ICD-10-CM

## 2024-04-28 ENCOUNTER — Encounter: Payer: Self-pay | Admitting: Nurse Practitioner
